# Patient Record
Sex: Male | Born: 1945 | Race: White | Hispanic: No | Marital: Married | Smoking: Never smoker
Health system: Southern US, Community
[De-identification: ages and names within clinical notes are randomized; demographics above are authoritative.]

## PROBLEM LIST (undated history)

## (undated) DIAGNOSIS — M1990 Unspecified osteoarthritis, unspecified site: Secondary | ICD-10-CM

## (undated) DIAGNOSIS — F419 Anxiety disorder, unspecified: Secondary | ICD-10-CM

## (undated) DIAGNOSIS — E039 Hypothyroidism, unspecified: Secondary | ICD-10-CM

## (undated) DIAGNOSIS — R42 Dizziness and giddiness: Secondary | ICD-10-CM

## (undated) DIAGNOSIS — I10 Essential (primary) hypertension: Secondary | ICD-10-CM

## (undated) HISTORY — DX: Essential (primary) hypertension: I10

## (undated) HISTORY — PX: TONSILLECTOMY: SUR1361

## (undated) HISTORY — DX: Anxiety disorder, unspecified: F41.9

---

## 1996-05-20 DIAGNOSIS — Z87442 Personal history of urinary calculi: Secondary | ICD-10-CM

## 1996-05-20 HISTORY — DX: Personal history of urinary calculi: Z87.442

## 2013-05-20 DIAGNOSIS — I739 Peripheral vascular disease, unspecified: Secondary | ICD-10-CM

## 2013-05-20 HISTORY — DX: Peripheral vascular disease, unspecified: I73.9

## 2013-11-08 ENCOUNTER — Encounter: Payer: Self-pay | Admitting: Cardiovascular Disease

## 2014-02-08 ENCOUNTER — Encounter: Payer: Self-pay | Admitting: Cardiovascular Disease

## 2014-02-24 ENCOUNTER — Encounter: Payer: Self-pay | Admitting: Cardiovascular Disease

## 2014-05-16 ENCOUNTER — Ambulatory Visit (INDEPENDENT_AMBULATORY_CARE_PROVIDER_SITE_OTHER): Payer: Medicare Other | Admitting: Cardiovascular Disease

## 2014-05-16 ENCOUNTER — Encounter: Payer: Self-pay | Admitting: Cardiovascular Disease

## 2014-05-16 VITALS — BP 171/79 | HR 65 | Ht 71.0 in | Wt 232.0 lb

## 2014-05-16 DIAGNOSIS — I10 Essential (primary) hypertension: Secondary | ICD-10-CM

## 2014-05-16 DIAGNOSIS — F419 Anxiety disorder, unspecified: Secondary | ICD-10-CM

## 2014-05-16 DIAGNOSIS — R55 Syncope and collapse: Secondary | ICD-10-CM

## 2014-05-16 MED ORDER — CLONAZEPAM 1 MG PO TABS: 1.0000 mg | ORAL_TABLET | Freq: Every day | ORAL | Status: DC | PRN

## 2014-05-16 MED ORDER — AMLODIPINE BESYLATE 2.5 MG PO TABS: 2.5000 mg | ORAL_TABLET | Freq: Every day | ORAL | Status: DC

## 2014-05-16 NOTE — Progress Notes (Signed)
Patient ID: Douglas CoderRussell R Corprew Brewer., male   DOB: 08/13/1945, 68 y.o.   MRN: [id number]       CARDIOLOGY CONSULT NOTE  Patient ID: Douglas CoderRussell R Kouns Brewer. MRN: [id number] DOB/AGE: 66/05/1945 66 y.o.  Admit date: (Not on file) Primary Physician Douglas Brewer,Douglas M, MD  Reason for Consultation: syncope  HPI: The patient is a 68 year old male with a history of hypertension who has been referred for the evaluation of syncope. He has had cardiovascular testing performed at the [facility] in [place]. He underwent a tilt table study on 04/20/2014 which demonstrated postural hypotension and syncope after an SL nitro challenge. A 3 week event monitor demonstrated normal sinus rhythm with no significant pauses or arrhythmias. He has previously undergone stress testing, echocardiography, and a carotid duplex study in October 2015 which were all normal. He had since been started on fludrocortisone and metoprolol, but the Florinef was discontinued due to markedly elevated BP. He had been on amlodipine 5 mg in the past.   His ejection fraction is 60-65% and the echo also showed moderate pulmonary hypertension, RVSP 51 mmHg. Stress testing on 02/24/2014 did not show any evidence of myocardial ischemia or scar.  MRA of the brain on 11/08/13 did not demonstrate any significant intracranial abnormalities. He has a history of frequent dizzy spells which occur when he is either sitting or standing. 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 One syncopal episode occurred while he was walking in his home and the other one occurred while he was riding his lawnmower. He does have antecedent dizziness with complete loss of consciousness. He denies chest pain and palpitations prior to these episodes. He denies leg swelling. He is here with his wife.  Soc: Retired from [employer], where he worked x 27 years.  Lives in [place], [place].  Not on File  Current Outpatient Prescriptions  Medication Sig Dispense Refill  . amitriptyline (ELAVIL) 25 MG tablet Take 25 mg by mouth at bedtime.    Marland Kitchen. aspirin 81 MG tablet Take 81 mg by mouth daily.    . clonazePAM (KLONOPIN) 1 MG tablet Take 1 mg by mouth daily as needed for anxiety (Take one tablet by mouth every evening as needed for insomnia).    . diazepam (VALIUM) 5 MG tablet Take 5 mg by mouth as needed for anxiety (Take 1 once or twice daily as needed).    Marland Kitchen. levothyroxine (SYNTHROID, LEVOTHROID) 25 MCG tablet Take 25 mcg by mouth daily before breakfast.    . losartan (COZAAR) 100 MG tablet Take 100 mg by mouth daily.    . metoprolol tartrate (LOPRESSOR) 25 MG tablet Take 12.5 mg by mouth 2 (two) times daily.     No current facility-administered medications for this visit.    No past medical history on file.  No past surgical history on file.  History   Social History  . Marital Status: Married    Spouse Name: N/A    Number of Children: N/A  . Years of Education: N/A   Occupational History  . Not on file.   Social History Main Topics  . Smoking status: Never Smoker   . Smokeless tobacco: Former NeurosurgeonUser    Types: Chew    Quit date: 04/19/1988  . Alcohol Use: No  . Drug Use: No  . Sexual Activity:    Partners: Female   Other Topics  Concern  . Not on file   Social History Narrative  . No narrative on file     No family history of premature CAD in 1st degree relatives.  Prior to Admission medications   Medication Sig Start Date End Date Taking? Authorizing Provider  amitriptyline (ELAVIL) 25 MG tablet Take 25 mg by mouth at bedtime.   Yes Historical Provider, MD  aspirin 81 MG tablet Take 81 mg by mouth daily.   Yes Historical Provider, MD  clonazePAM (KLONOPIN) 1 MG tablet Take 1 mg by mouth daily as needed for anxiety (Take one tablet by mouth every evening as needed for insomnia).   Yes Historical Provider, MD  diazepam (VALIUM) 5  MG tablet Take 5 mg by mouth as needed for anxiety (Take 1 once or twice daily as needed).   Yes Historical Provider, MD  levothyroxine (SYNTHROID, LEVOTHROID) 25 MCG tablet Take 25 mcg by mouth daily before breakfast.   Yes Historical Provider, MD  losartan (COZAAR) 100 MG tablet Take 100 mg by mouth daily.   Yes Historical Provider, MD  metoprolol tartrate (LOPRESSOR) 25 MG tablet Take 12.5 mg by mouth 2 (two) times daily.   Yes Historical Provider, MD     Review of systems complete and found to be negative unless listed above in HPI     Physical exam Height 5\' 11"  (1.803 Brewer), weight 232 lb (105.235 kg).   BP 164/85, 171/79  Pulse 65  SpO2 96%   General: NAD Neck: No JVD, no thyromegaly or thyroid nodule.  Lungs: Clear to auscultation bilaterally with normal respiratory effort. CV: Nondisplaced PMI. Regular rate and rhythm, normal S1/S2, no S3/S4, no murmur.  No peripheral edema.  No carotid bruit.  Normal pedal pulses.  Abdomen: Soft, nontender, no hepatosplenomegaly, no distention.  Skin: Intact without lesions or rashes.  Neurologic: Alert and oriented x 3.  Psych: Normal affect. Extremities: No clubbing or cyanosis.  HEENT: Normal.   ECG: Most recent ECG reviewed.  Labs:  No results found for: WBC, HGB, HCT, MCV, PLT No results for input(s): NA, K, CL, CO2, BUN, CREATININE, CALCIUM, PROT, BILITOT, ALKPHOS, ALT, AST, GLUCOSE in the last 168 hours.  Invalid input(s): LABALBU No results found for: CKTOTAL, CKMB, CKMBINDEX, TROPONINI No results found for: CHOL No results found for: HDL No results found for: LDLCALC No results found for: TRIG No results found for: CHOLHDL No results found for: LDLDIRECT       Studies: No results found.  ASSESSMENT AND PLAN:  1. Reflex syncope, predominantly vasovagal: This appears to be related to postural hypotension. I educated him on the importance of moving very slowly from the sitting to standing position. I will also prescribe  thigh high compression stockings 20-30 mmHg in order to ameliorate any potential venous pooling which could be contributing to these episodes. Given his history of hypothyroidism, there may be an autoimmune component as well. 2. Essential HTN: Elevated today and SBP in 200 mmHg range at home earlier today. Will start very low dose amlodipine 2.5 mg daily. 3. Anxiety: Will refill clonazepam 1 mg prn daily (thirty pills, no refills), with further prescriptions to be obtained from his PCP.  Dispo: f/u 6 weeks.  Signed: Prentice DockerSuresh Kameran Lallier, Brewer.D., F.A.C.C.  05/16/2014, 2:29 PM

## 2014-05-16 NOTE — Patient Instructions (Signed)
   Begin Norvasc 2.5mg  daily   Clonazepam - one time refill given today, future refills should come from Dr. Willaim [facility]anePark.    Printed scripts provided for medications above. Continue all other medications.   Thigh high compression stockings - script provided Follow up in  6 weeks

## 2014-05-23 ENCOUNTER — Telehealth: Payer: Self-pay | Admitting: Cardiovascular Disease

## 2014-05-23 DIAGNOSIS — R55 Syncope and collapse: Secondary | ICD-10-CM

## 2014-05-23 NOTE — Telephone Encounter (Signed)
Patient still getting really dizzy and almost passed out last about 10-15 seconds. This happened several times this past weekend.

## 2014-05-24 NOTE — Telephone Encounter (Signed)
Is he wearing the thigh high compression stockings I prescribed? What has BP been during these episodes?

## 2014-05-25 NOTE — Telephone Encounter (Signed)
Would have him wear an ambulatory monitor, for a week ideally.

## 2014-05-25 NOTE — Telephone Encounter (Signed)
Patient states he is wearing the stockings.  BP this morning 146/100.  Has not checked his BP during any of these episodes.  Stated he was out in the woods with one of them & at [employer] with the other.  Has had 3 episodes since last OV.  Message sent to provider for advice.

## 2014-05-25 NOTE — Telephone Encounter (Signed)
Were you aware that he had Life Watch monitor 03-22-14 to 04-11-14?  See EPIC under media section 03/22/2014.

## 2014-05-26 NOTE — Telephone Encounter (Signed)
Left message to return call 

## 2014-05-26 NOTE — Telephone Encounter (Signed)
Ambulatory BP monitor x 1 week.

## 2014-05-26 NOTE — Telephone Encounter (Signed)
Patient notified.  Will put in order & notify Douglas Brewer(PCC) for scheduling.

## 2014-06-02 ENCOUNTER — Ambulatory Visit (HOSPITAL_COMMUNITY): Admission: RE | Admit: 2014-06-02 | Payer: Medicare Other | Source: Ambulatory Visit

## 2014-06-06 ENCOUNTER — Ambulatory Visit (HOSPITAL_COMMUNITY)
Admission: RE | Admit: 2014-06-06 | Discharge: 2014-06-06 | Disposition: A | Discharge status: Home / Self Care | Payer: Medicare Other | Source: Ambulatory Visit | Attending: Cardiovascular Disease | Admitting: Cardiovascular Disease

## 2014-06-06 ENCOUNTER — Telehealth: Payer: Self-pay | Admitting: *Deleted

## 2014-06-06 DIAGNOSIS — I10 Essential (primary) hypertension: Secondary | ICD-10-CM | POA: Insufficient documentation

## 2014-06-06 DIAGNOSIS — R55 Syncope and collapse: Secondary | ICD-10-CM | POA: Diagnosis present

## 2014-06-06 NOTE — Telephone Encounter (Signed)
AP stress lab stated they put ambulatory BP monitor on pt for 24 hrs, that unless pt is coming daily, it only is 24 hr monitor. [place] lives in [place] and would be difficult to come daily. Will forward to Dr. Purvis SheffieldKoneswaran

## 2014-06-06 NOTE — Progress Notes (Signed)
24 hour Ambulatory Blood Pressure Monitor in progress. 

## 2014-06-20 ENCOUNTER — Telehealth: Payer: Self-pay | Admitting: *Deleted

## 2014-06-20 MED ORDER — AMLODIPINE BESYLATE 2.5 MG PO TABS: 5.0000 mg | ORAL_TABLET | Freq: Every day | ORAL | Status: DC

## 2014-06-20 NOTE — Telephone Encounter (Signed)
Per Dr. Purvis SheffieldKoneswaran recent review of ambulatory BP monitor - increase Amlodipine to 5mg  daily.  Continue to log readings & bring to follow up OV on 06/27/14 with Dr. Purvis SheffieldKoneswaran.  Patient verbalized understanding.

## 2014-06-22 ENCOUNTER — Encounter: Payer: Self-pay | Admitting: *Deleted

## 2014-06-27 ENCOUNTER — Encounter: Payer: Self-pay | Admitting: Cardiovascular Disease

## 2014-06-27 ENCOUNTER — Ambulatory Visit (INDEPENDENT_AMBULATORY_CARE_PROVIDER_SITE_OTHER): Payer: Medicare Other | Admitting: Cardiovascular Disease

## 2014-06-27 VITALS — BP 158/98 | HR 73 | Ht 71.0 in | Wt 247.0 lb

## 2014-06-27 DIAGNOSIS — R42 Dizziness and giddiness: Secondary | ICD-10-CM

## 2014-06-27 DIAGNOSIS — I10 Essential (primary) hypertension: Secondary | ICD-10-CM

## 2014-06-27 DIAGNOSIS — R55 Syncope and collapse: Secondary | ICD-10-CM

## 2014-06-27 MED ORDER — AMLODIPINE BESYLATE 2.5 MG PO TABS: ORAL_TABLET | ORAL | Status: DC

## 2014-06-27 NOTE — Progress Notes (Signed)
Patient ID: Douglas Scurlock., male   DOB: [birth year], 69 y.o.   MRN: [id number]      SUBJECTIVE: The patient returns for follow-up of reflex syncope and hypertension. He has a history of anxiety as well. He normally wakes up and takes his blood pressure medications. He has been checking his blood pressure for the past week and morning readings have been in the 120-130 systolic range. Three evening readings have shown systolic readings ranging from 156-183. He denies chest pain and shortness of breath he has had two episodes of dizziness but denies syncope. He has dizziness when turning his head to the left.   Review of Systems: As per "subjective", otherwise negative.  Not on File  Current Outpatient Prescriptions  Medication Sig Dispense Refill  . amitriptyline (ELAVIL) 25 MG tablet Take 25 mg by mouth at bedtime.    Marland Kitchen amLODipine (NORVASC) 2.5 MG tablet Take 2 tablets (5 mg total) by mouth daily.    Marland Kitchen aspirin 81 MG tablet Take 81 mg by mouth daily.    . clonazePAM (KLONOPIN) 1 MG tablet Take 1 tablet (1 mg total) by mouth daily as needed for anxiety (Take one tablet by mouth every evening as needed for insomnia). 30 tablet 0  . diazepam (VALIUM) 5 MG tablet Take 5 mg by mouth as needed for anxiety (Take 1 once or twice daily as needed).    Marland Kitchen levothyroxine (SYNTHROID, LEVOTHROID) 25 MCG tablet Take 25 mcg by mouth daily before breakfast.    . losartan (COZAAR) 100 MG tablet Take 100 mg by mouth daily.    . metoprolol tartrate (LOPRESSOR) 25 MG tablet Take 12.5 mg by mouth 2 (two) times daily.     No current facility-administered medications for this visit.    Past Medical History  Diagnosis Date  . Hypertension   . Anxiety     No past surgical history on file.  History   Social History  . Marital Status: Married    Spouse Name: N/A    Number of Children: N/A  . Years of Education: N/A   Occupational History  .      retired from [employer]   Social History Main Topics    . Smoking status: Never Smoker   . Smokeless tobacco: Former Neurosurgeon    Types: Chew    Quit date: 04/19/1988  . Alcohol Use: No  . Drug Use: No  . Sexual Activity:    Partners: Female   Other Topics Concern  . Not on file   Social History Narrative     Filed Vitals:   06/27/14 1358  BP: 158/98  Pulse: 73  Height:  (1.803 m)  Weight: 247 lb (112.038 kg)  SpO2: 97%    PHYSICAL EXAM General: NAD HEENT: Normal. Neck: No JVD, no thyromegaly. Lungs: Clear to auscultation bilaterally with normal respiratory effort. CV: Nondisplaced PMI.  Regular rate and rhythm, normal S1/S2, no S3/S4, no murmur. No pretibial or periankle edema.  No carotid bruit.  Normal pedal pulses.  Abdomen: Soft, nontender, no hepatosplenomegaly, no distention.  Neurologic: Alert and oriented x 3.  Psych: Normal affect. Skin: Normal. Musculoskeletal: Normal range of motion, no gross deformities. Extremities: No clubbing or cyanosis.   ECG: Most recent ECG reviewed.      ASSESSMENT AND PLAN: 1. Reflex syncope, predominantly vasovagal: No recurrences. This appears to be related to postural hypotension. I previously educated him on the importance of moving very slowly from the sitting to standing position.  I previously prescribed thigh high compression stockings 20-30 mmHg in order to ameliorate any potential venous pooling which could be contributing to these episodes. Given his history of hypothyroidism, there may be an autoimmune component as well. 2. Essential HTN: Elevated today. Will increase amlodipine to 5 mg q am and 2.5 mg q pm given elevated evening readings. I have asked him to check his BP 3-4 times per week and to take both morning and evening readings. 3. Dizziness: This appears to be consistent with benign positional vertigo.  Dispo: f/u 6 months.  Prentice DockerSuresh Mattelyn Imhoff, M.D., F.A.C.C.

## 2014-06-27 NOTE — Patient Instructions (Addendum)
   Increase Amlodipine to 5mg  every morning & add extra 2.5mg  every evening - new sent to pharmacy today.  Continue all other medications.   Your physician wants you to follow up in: 6 months.  You will receive a reminder letter in the mail one-two months in advance.  If you don't receive a letter, please call our office to schedule the follow up appointment

## 2014-08-30 ENCOUNTER — Ambulatory Visit (INDEPENDENT_AMBULATORY_CARE_PROVIDER_SITE_OTHER): Payer: Medicare Other | Admitting: Cardiovascular Disease

## 2014-08-30 ENCOUNTER — Encounter: Payer: Self-pay | Admitting: Cardiovascular Disease

## 2014-08-30 VITALS — BP 138/98 | HR 65 | Ht 71.0 in | Wt 249.0 lb

## 2014-08-30 DIAGNOSIS — R55 Syncope and collapse: Secondary | ICD-10-CM

## 2014-08-30 DIAGNOSIS — F419 Anxiety disorder, unspecified: Secondary | ICD-10-CM

## 2014-08-30 DIAGNOSIS — R5383 Other fatigue: Secondary | ICD-10-CM | POA: Diagnosis not present

## 2014-08-30 DIAGNOSIS — H938X2 Other specified disorders of left ear: Secondary | ICD-10-CM

## 2014-08-30 DIAGNOSIS — R42 Dizziness and giddiness: Secondary | ICD-10-CM

## 2014-08-30 DIAGNOSIS — I10 Essential (primary) hypertension: Secondary | ICD-10-CM

## 2014-08-30 NOTE — Patient Instructions (Signed)
   Referral to Neurology - Dr. Milon DikesJohn Brewer  Referral to [facility] Continue all current medications. Labs for TSH, free T4, CBC, BMET Office will contact with results via phone or letter.   Your physician wants you to follow up in: 6 months.  You will receive a reminder letter in the mail one-two months in advance.  If you don't receive a letter, please call our office to schedule the follow up appointment

## 2014-08-30 NOTE — Progress Notes (Signed)
Patient ID: Yvetta CoderRussell R Tomassetti Jr., male   DOB: 11/08/1945, 69 y.o.   MRN: [id number]      SUBJECTIVE: The patient returns for follow-up of reflex syncope and hypertension. He has a history of anxiety as well. Since his last visit with me, he has had several dizzy spells and one syncopal episode lasting 10 seconds. When he bends to the left he feels markedly dizzy. He denies chest pain, palpitations, and shortness of breath. He had a recent sinus infection but his episodic dizziness preceded this. He always has a sensation of left ear fullness prior to these episodes of severe dizziness.   In summary, he has had cardiovascular testing performed at the [facility] in [place]. He underwent a tilt table study on 04/20/2014 which demonstrated postural hypotension and syncope after an SL nitro challenge. A 3 week event monitor demonstrated normal sinus rhythm with no significant pauses or arrhythmias. He has previously undergone stress testing, echocardiography, and a carotid duplex study in October 2015 which were all normal. He had since been started on fludrocortisone and metoprolol, but the Florinef was discontinued due to markedly elevated BP.    His ejection fraction is 60-65% and the echo also showed moderate pulmonary hypertension, RVSP 51 mmHg. Stress testing on 02/24/2014 did not show any evidence of myocardial ischemia or scar.  MRA of the brain on 11/08/13 did not demonstrate any significant intracranial abnormalities. 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   He has hypothyroidism with no labs checked this year.   Review of Systems: As per "subjective", otherwise negative.  Not on File  Current Outpatient Prescriptions  Medication Sig Dispense Refill  . amitriptyline (ELAVIL) 25 MG tablet Take 25 mg by mouth at bedtime.    Marland Kitchen. amLODipine (NORVASC) 2.5 MG  tablet Take 5mg  (2 tabs) by mouth every morning & 2.5mg  (1 tab) every evening 90 tablet 6  . aspirin 81 MG tablet Take 81 mg by mouth daily.    . clonazePAM (KLONOPIN) 1 MG tablet Take 1 tablet (1 mg total) by mouth daily as needed for anxiety (Take one tablet by mouth every evening as needed for insomnia). 30 tablet 0  . diazepam (VALIUM) 5 MG tablet Take 5 mg by mouth as needed for anxiety (Take 1 once or twice daily as needed).    Marland Kitchen. levothyroxine (SYNTHROID, LEVOTHROID) 25 MCG tablet Take 25 mcg by mouth daily before breakfast.    . losartan (COZAAR) 100 MG tablet Take 100 mg by mouth daily.    . metoprolol tartrate (LOPRESSOR) 25 MG tablet Take 12.5 mg by mouth 2 (two) times daily.     No current facility-administered medications for this visit.    Past Medical History  Diagnosis Date  . Hypertension   . Anxiety     History reviewed. No pertinent past surgical history.  History   Social History  . Marital Status: Married    Spouse Name: N/A  . Number of Children: N/A  . Years of Education: N/A   Occupational History  .      retired from [employer]   Social History Main Topics  . Smoking status: Never Smoker   . Smokeless tobacco: Former NeurosurgeonUser    Types: Chew    Quit date: 04/19/1988  . Alcohol Use: No  . Drug Use: No  . Sexual Activity:    Partners:  Female   Other Topics Concern  . Not on file   Social History Narrative     Filed Vitals:   08/30/14 1316  BP: 138/98  Pulse: 65  Height:  (1.803 m)  Weight: 249 lb (112.946 kg)  SpO2: 95%    PHYSICAL EXAM General: NAD HEENT: Normal. Neck: No JVD, no thyromegaly. Lungs: Clear to auscultation bilaterally with normal respiratory effort. CV: Nondisplaced PMI.  Regular rate and rhythm, normal S1/S2, no S3/S4, no murmur. No pretibial or periankle edema.  No carotid bruit.  Normal pedal pulses.  Abdomen: Soft, nontender, no hepatosplenomegaly, no distention.  Neurologic: Alert and oriented x 3.  Psych: Normal  affect. Skin: Normal. Musculoskeletal: Normal range of motion, no gross deformities. Extremities: No clubbing or cyanosis.   ECG: Most recent ECG reviewed.      ASSESSMENT AND PLAN: 1. Reflex syncope, predominantly vasovagal: Noncardiac in etiology. No cardioinhibitory response; thus, pacing not indicated. Given his h/o hypothyoididsm, will check TSH and free T4, along with CBC and BMET.   I previously educated him on the importance of moving very slowly from the sitting to standing position. I previously prescribed thigh high compression stockings 20-30 mmHg in order to ameliorate any potential venous pooling which could be contributing to these episodes. Given his history of hypothyroidism, there may be an autoimmune component as well. I will make referrals to neurology and ENT in [facility] to obtain their input.  2. Essential HTN: Mildly elevated today but much improved overall. He monitors this regularly. Continue present therapy.  3. Dizziness: This appears to be consistent with benign positional vertigo. He felt no improvement with meclizine in the past.  Dispo: f/u 6 months.  Time spent: 40 minutes, of which greater than 50% was spent reviewing symptoms, relevant blood tests and studies, and discussing management plan with the patient.   Prentice Docker, M.D., F.A.C.C.

## 2014-09-09 ENCOUNTER — Other Ambulatory Visit: Payer: Self-pay | Admitting: Otolaryngology

## 2014-09-09 DIAGNOSIS — H903 Sensorineural hearing loss, bilateral: Secondary | ICD-10-CM

## 2014-09-09 DIAGNOSIS — H905 Unspecified sensorineural hearing loss: Secondary | ICD-10-CM

## 2014-09-15 ENCOUNTER — Encounter: Payer: Self-pay | Admitting: *Deleted

## 2014-09-20 ENCOUNTER — Encounter: Payer: Self-pay | Admitting: *Deleted

## 2014-09-20 ENCOUNTER — Telehealth: Payer: Self-pay | Admitting: *Deleted

## 2014-09-20 NOTE — Telephone Encounter (Signed)
Notes Recorded by Lesle ChrisAngela G Rhiana Morash, LPN on 1/6/10965/07/2014 at 8:28 AM Patient notified of results by letter. Will include diet information.

## 2014-09-20 NOTE — Telephone Encounter (Signed)
-----   Message from Laqueta LindenSuresh A Koneswaran, MD sent at 09/19/2014  4:40 PM EDT ----- Rip Harbourk. LDL elevated, would recommend dietary modification.

## 2014-09-22 ENCOUNTER — Ambulatory Visit
Admission: RE | Admit: 2014-09-22 | Discharge: 2014-09-22 | Disposition: A | Discharge status: Home / Self Care | Payer: Medicare Other | Source: Ambulatory Visit | Attending: Otolaryngology | Admitting: Otolaryngology

## 2014-09-22 DIAGNOSIS — H903 Sensorineural hearing loss, bilateral: Secondary | ICD-10-CM

## 2014-09-22 DIAGNOSIS — H905 Unspecified sensorineural hearing loss: Secondary | ICD-10-CM

## 2014-09-22 MED ORDER — GADOBENATE DIMEGLUMINE 529 MG/ML IV SOLN
20.0000 mL | Freq: Once | INTRAVENOUS | Status: AC | PRN
Start: 2014-09-22 — End: 2014-09-22
  Administered 2014-09-22: 20 mL via INTRAVENOUS

## 2014-10-04 ENCOUNTER — Telehealth: Payer: Self-pay | Admitting: Cardiovascular Disease

## 2014-10-04 NOTE — Telephone Encounter (Signed)
5/17 patient callled stating that he be seeing a Neurologist in June at [facility]. He wants to wait until after he sees this doctor before making next appointment.

## 2014-10-04 NOTE — Telephone Encounter (Signed)
Patient was down for recall in June 2016.

## 2014-11-23 ENCOUNTER — Telehealth: Payer: Self-pay | Admitting: Cardiovascular Disease

## 2014-11-23 NOTE — Telephone Encounter (Addendum)
Discussed below with patient.  Last seen by Dr. Purvis SheffieldKoneswaran in April.  Syncope & dizziness were felt to be non-cardiac.  Sent to see Neurology & ENT.  ENT sent for MRI - did not show anything.  Neurologist - put on Divalproex.  Stopped due to side effects.  BP this morning was 126/71.  Overall, running good.  Went to PMD & BP 155/81.  States up at times when go to [place].  Was there for medication refills.  Stated that Neurology felt that he should see cardiology back for follow up.  Stated that this has been going on x 4 years and no one can figure out what his problem is.  Stated these episodes occur when he feels pressure on his ear.  Earlier appointment given for Tuesday, July 12 at 2:00 in [place] office with Dr. Purvis SheffieldKoneswaran.

## 2014-11-23 NOTE — Telephone Encounter (Signed)
Mr. Douglas Brewer called stating that he felt lightheaded approximately 25 minutes ago.  States that it lasted just a few seconds. He states " I feel very weak"

## 2014-11-29 ENCOUNTER — Encounter: Payer: Self-pay | Admitting: Cardiovascular Disease

## 2014-11-29 ENCOUNTER — Ambulatory Visit (INDEPENDENT_AMBULATORY_CARE_PROVIDER_SITE_OTHER): Payer: Medicare Other | Admitting: Cardiovascular Disease

## 2014-11-29 VITALS — BP 140/86 | HR 60 | Ht 71.0 in | Wt 248.0 lb

## 2014-11-29 DIAGNOSIS — R55 Syncope and collapse: Secondary | ICD-10-CM

## 2014-11-29 DIAGNOSIS — R5383 Other fatigue: Secondary | ICD-10-CM | POA: Diagnosis not present

## 2014-11-29 DIAGNOSIS — F419 Anxiety disorder, unspecified: Secondary | ICD-10-CM

## 2014-11-29 DIAGNOSIS — R42 Dizziness and giddiness: Secondary | ICD-10-CM | POA: Diagnosis not present

## 2014-11-29 DIAGNOSIS — H938X2 Other specified disorders of left ear: Secondary | ICD-10-CM | POA: Diagnosis not present

## 2014-11-29 NOTE — Patient Instructions (Signed)
Your physician recommends that you schedule a follow-up appointment in: as needed with Dr Purvis SheffieldKoneswaran   Your physician recommends that you continue on your current medications as directed. Please refer to the Current Medication list given to you today.    We will make you an apt to see Dr Graciela HusbandsKlein, our electrophysiologist       Thank you for choosing [facility] !

## 2014-11-29 NOTE — Progress Notes (Signed)
Patient ID: Douglas CoderRussell R Novello Jr., male   DOB: 11/26/1945, 69 y.o.   MRN: [id number]      SUBJECTIVE: The patient returns for follow-up of reflex syncope and hypertension. He has a history of anxiety as well.  Labs in April showed normal TSH, unremarkable CBC and basic metabolic panel, and elevated LDL of 123.  He reportedly saw an ENT specialist who obtained an MRI which did not show any remarkable findings (chronic small vessel ischemic disease). He was evaluated by neurology who placed him on divalproex but this was stopped due to side effects.  He said he initially felt better after taking 250 mg of divalproex prescribed by Dr. Leanna BattlesMalone. When he increased it to twice daily dosing, he began to have leg swelling and insomnia. On 250 mg, he felt good enough and mowed three lawns without difficulty.   He had a transient episode of syncope 2 weeks ago and fell and hit his head but immediately regained consciousness. Since being on amitriptyline, he has no longer had any vomiting. He said he underwent an EEG which was also found to be normal.  He denies chest pain, palpitations, and shortness of breath. He always has a sensation of left ear fullness prior to these episodes of severe dizziness.  In summary, he has had cardiovascular testing performed at the [facility] in [place]. He underwent a tilt table study on 04/20/2014 which demonstrated postural hypotension and syncope after an SL nitro challenge. A 3 week event monitor demonstrated normal sinus rhythm with no significant pauses or arrhythmias. He has previously undergone stress testing, echocardiography, and a carotid duplex study in October 2015 which were all normal. He had since been started on fludrocortisone and metoprolol, but the Florinef was discontinued due to markedly elevated BP.   His ejection fraction is 60-65% and the echo also showed moderate pulmonary hypertension, RVSP 51 mmHg. Stress testing on  02/24/2014 did not show any evidence of myocardial ischemia or scar.  MRA of the brain on 11/08/13 did not demonstrate any significant intracranial abnormalities. 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   He has hypothyroidism.   Review of Systems: As per "subjective", otherwise negative.  Allergies  Allergen Reactions  . Divalproex Sodium Swelling    Leg swell  . Topamax [Topiramate] Other (See Comments)    Lowers blood pressure     Current Outpatient Prescriptions  Medication Sig Dispense Refill  . amitriptyline (ELAVIL) 25 MG tablet Take 25 mg by mouth at bedtime.    Marland Kitchen. amLODipine (NORVASC) 2.5 MG tablet Take 5mg  (2 tabs) by mouth every morning & 2.5mg  (1 tab) every evening 90 tablet 6  . aspirin 81 MG tablet Take 81 mg by mouth daily.    . clonazePAM (KLONOPIN) 1 MG tablet Take 1 tablet (1 mg total) by mouth daily as needed for anxiety (Take one tablet by mouth every evening as needed for insomnia). 30 tablet 0  . diazepam (VALIUM) 5 MG tablet Take 5 mg by mouth as needed for anxiety (Take 1 once or twice daily as needed).    Marland Kitchen. levothyroxine (SYNTHROID, LEVOTHROID) 25 MCG tablet Take 25 mcg by mouth daily before breakfast.    . losartan (COZAAR) 100 MG tablet Take 100 mg by mouth daily.    . metoprolol tartrate (LOPRESSOR) 25 MG tablet Take 12.5 mg by mouth 2 (two) times  daily.     No current facility-administered medications for this visit.    Past Medical History  Diagnosis Date  . Hypertension   . Anxiety     No past surgical history on file.  History   Social History  . Marital Status: Married    Spouse Name: N/A  . Number of Children: N/A  . Years of Education: N/A   Occupational History  .      retired from [employer]   Social History Main Topics  . Smoking status: Never Smoker   . Smokeless tobacco: Former Neurosurgeon    Types: Chew    Quit date:  04/19/1988  . Alcohol Use: No  . Drug Use: No  . Sexual Activity:    Partners: Female, Male   Other Topics Concern  . Not on file   Social History Narrative     Filed Vitals:   11/29/14 1344  BP: 140/86  Pulse: 60  Height:  (1.803 m)  Weight: 248 lb (112.492 kg)  SpO2: 94%    PHYSICAL EXAM General: NAD HEENT: Normal. Neck: No JVD, no thyromegaly. Lungs: Clear to auscultation bilaterally with normal respiratory effort. CV: Nondisplaced PMI.  Regular rate and rhythm, normal S1/S2, no S3/S4, no murmur. No pretibial or periankle edema.  No carotid bruit.  Normal pedal pulses.  Abdomen: Soft, nontender, no hepatosplenomegaly, no distention.  Neurologic: Alert and oriented x 3.  Psych: Normal affect. Skin: Normal. Musculoskeletal: Normal range of motion, no gross deformities. Extremities: No clubbing or cyanosis.   ECG: Most recent ECG reviewed.      ASSESSMENT AND PLAN: 1. Reflex syncope, predominantly vasovagal: Appears noncardiac in etiology. No cardioinhibitory response; thus, pacing not indicated.   I previously educated him on the importance of moving very slowly from the sitting to standing position. I previously prescribed thigh high compression stockings 20-30 mmHg in order to ameliorate any potential venous pooling which could be contributing to these episodes. Given his history of hypothyroidism, there may be an autoimmune component as well. I previously made referrals to neurology and ENT in [facility] to obtain their input with details noted above. Will have him see Dr. Berton Mount to obtain his input. If Dr. Graciela Husbands is unable to provide any additional input, would consider a referral to [facility].  2. Essential HTN: Mildly elevated today but much improved overall. He monitors this regularly. Continue present therapy.  3. Dizziness: This appears to be consistent with benign positional vertigo. He felt no improvement with meclizine in the past.  Dispo: f/u  prn.  Prentice Docker, M.D., F.A.C.C.

## 2014-12-02 ENCOUNTER — Other Ambulatory Visit: Payer: Self-pay

## 2014-12-07 ENCOUNTER — Other Ambulatory Visit: Payer: Self-pay

## 2014-12-07 ENCOUNTER — Encounter: Payer: Self-pay | Admitting: Internal Medicine

## 2014-12-07 ENCOUNTER — Ambulatory Visit (INDEPENDENT_AMBULATORY_CARE_PROVIDER_SITE_OTHER): Payer: Medicare Other | Admitting: Internal Medicine

## 2014-12-07 VITALS — BP 160/100 | HR 56 | Ht 71.0 in | Wt 248.6 lb

## 2014-12-07 DIAGNOSIS — R55 Syncope and collapse: Secondary | ICD-10-CM | POA: Diagnosis not present

## 2014-12-07 NOTE — Patient Instructions (Signed)
Medication Instructions:  Your physician recommends that you continue on your current medications as directed. Please refer to the Current Medication list given to you today.   Labwork: None ordered   Testing/Procedures: None ordered   Follow-Up: Your physician recommends that you schedule a follow-up appointment as needed    Any Other Special Instructions Will Be Listed Below (If Applicable).   

## 2014-12-07 NOTE — Assessment & Plan Note (Signed)
The patient has neurally mediated syncope and most likely has predominantly vasodepression. There has been no evidence of bradycardia. I have recommended he maintain adequate hydration, avoid caffeine and ETOH. We cannot ask him to increase his sodium intake as he has modest hypertension at baseline. Finally, I've asked the patient to lie down when he feels a spell coming on. I do not think that insertion of an implantable loop recorder will add much at this point.

## 2014-12-07 NOTE — Progress Notes (Signed)
      HPI Mr. Douglas Brewer is referred today by Dr. Darl HouseholderKoneswaren for evaluation of syncope. He has had multiple episodes of syncope including one which was associated with his wearing a cardiac monitor which showed no bradycardia. He usually has a warning where he feels weak, gets dizzy, feels warm and may have a HA. He underewent head up tilt table testing [facility] [place] which demonstrated a syncopal episode. In addition, the patient has hypertension and is on multiple medications. Allergies  Allergen Reactions  . Divalproex Sodium Swelling    Leg swell  . Topamax [Topiramate] Other (See Comments)    Lowers blood pressure      Current Outpatient Prescriptions  Medication Sig Dispense Refill  . amitriptyline (ELAVIL) 25 MG tablet Take 25 mg by mouth at bedtime.    Marland Kitchen. amLODipine (NORVASC) 5 MG tablet Take 5 mg by mouth daily.    Marland Kitchen. aspirin 81 MG tablet Take 81 mg by mouth daily.    . clonazePAM (KLONOPIN) 1 MG tablet Take 1 mg by mouth at bedtime. insomnia    . diazepam (VALIUM) 5 MG tablet Take 5 mg by mouth as needed for anxiety (Take 1 tablet once or twice daily as needed).     Marland Kitchen. levothyroxine (SYNTHROID, LEVOTHROID) 25 MCG tablet Take 25 mcg by mouth daily before breakfast.    . losartan (COZAAR) 100 MG tablet Take 100 mg by mouth daily.    . metoprolol tartrate (LOPRESSOR) 25 MG tablet Take 12.5 mg by mouth 2 (two) times daily.     No current facility-administered medications for this visit.     Past Medical History  Diagnosis Date  . Hypertension   . Anxiety     ROS:   All systems reviewed and negative except as noted in the HPI.   No past surgical history on file.   Family History  Problem Relation Age of Onset  . Stroke Mother   . Heart disease Father   . Heart attack Father   . Hypertension Brother      History   Social History  . Marital Status: Married    Spouse Name: N/A  . Number of Children: N/A  . Years of Education: N/A   Occupational  History  .      retired from [employer]   Social History Main Topics  . Smoking status: Never Smoker   . Smokeless tobacco: Former NeurosurgeonUser    Types: Chew    Quit date: 04/19/1988  . Alcohol Use: No  . Drug Use: No  . Sexual Activity:    Partners: Female, Male   Other Topics Concern  . Not on file   Social History Narrative     BP 160/100 mmHg  Pulse 56  Ht 5\' 11"  (1.803 m)  Wt 248 lb 9.6 oz (112.764 kg)  BMI 34.69 kg/m2  Physical Exam:  Well appearing 69 year old man, NAD HEENT: Unremarkable Neck:  6 cm JVD, no thyromegally Back:  No CVA tenderness Lungs:  Clear, with no wheezes, rales, or rhonchi. HEART:  Regular rate rhythm, no murmurs, no rubs, no clicks Abd:  soft, positive bowel sounds, no organomegally, no rebound, no guarding Ext:  2 plus pulses, no edema, no cyanosis, no clubbing Skin:  No rashes no nodules Neuro:  CN II through XII intact, motor grossly intact  EKG - nsr, cannot rule out inferior MI   Assess/Plan:

## 2014-12-13 ENCOUNTER — Ambulatory Visit: Payer: Medicare Other | Admitting: Cardiovascular Disease

## 2014-12-20 ENCOUNTER — Telehealth: Payer: Self-pay | Admitting: Cardiovascular Disease

## 2014-12-20 DIAGNOSIS — R55 Syncope and collapse: Secondary | ICD-10-CM

## 2014-12-20 NOTE — Telephone Encounter (Signed)
Refer to neurology at [facility] to start.

## 2014-12-20 NOTE — Telephone Encounter (Signed)
Pt states he wants to see someone at [facility] as his sx's have been persistent for 3 years

## 2014-12-20 NOTE — Telephone Encounter (Signed)
Referral placed to [facility] neurology,pt aware

## 2014-12-20 NOTE — Telephone Encounter (Signed)
Patient states that he has had three "pre syncopal episodes" since weekend. / tg

## 2014-12-20 NOTE — Telephone Encounter (Signed)
I am not certain there is anything we're able to offer given Dr. Lubertha Basque assessment and recommendations. I have made previous referrals to other specialists (ENT) to treat any additional potential confounding issues. Can consider referral to [facility].

## 2014-12-20 NOTE — Telephone Encounter (Signed)
See that Dr Ladona Ridgel saw pt, no special recommendations made.How do you want me to handle this?

## 2015-02-02 ENCOUNTER — Encounter: Payer: Self-pay | Admitting: Adult Health

## 2015-02-02 ENCOUNTER — Ambulatory Visit (INDEPENDENT_AMBULATORY_CARE_PROVIDER_SITE_OTHER): Payer: Medicare Other | Admitting: Adult Health

## 2015-02-02 VITALS — BP 150/98 | HR 67 | Ht 71.0 in | Wt 243.2 lb

## 2015-02-02 DIAGNOSIS — F488 Other specified nonpsychotic mental disorders: Secondary | ICD-10-CM | POA: Diagnosis not present

## 2015-02-02 DIAGNOSIS — I10 Essential (primary) hypertension: Secondary | ICD-10-CM | POA: Diagnosis not present

## 2015-02-02 MED ORDER — AMLODIPINE BESYLATE 5 MG PO TABS: 7.5000 mg | ORAL_TABLET | Freq: Every day | ORAL | Status: DC

## 2015-02-02 NOTE — Progress Notes (Deleted)
Name: Douglas Brewer.    DOB: 1946/03/07  Age: 69 y.o.  MR#: [id number]       PCP:  Ardyth Man, MD      Insurance: Payor: MEDICARE / Plan: MEDICARE PART A AND B / Product Type: *No Product type* /   CC:    Chief Complaint  Patient presents with  . Loss of Consciousness    Reflex  . Hypertension    VS Filed Vitals:   02/02/15 1541  BP: 150/98  Pulse: 67  Height: 5\' 11"  (1.803 m)  Weight: 243 lb 3.2 oz (110.315 kg)  SpO2: 96%    Weights Current Weight  02/02/15 243 lb 3.2 oz (110.315 kg)  12/07/14 248 lb 9.6 oz (112.764 kg)  11/29/14 248 lb (112.492 kg)    Blood Pressure  BP Readings from Last 3 Encounters:  02/02/15 150/98  12/07/14 160/100  11/29/14 140/86     Admit date:  (Not on file) Last encounter with RMR:  Visit date not found   Allergy Divalproex sodium and Topamax  Current Outpatient Prescriptions  Medication Sig Dispense Refill  . amitriptyline (ELAVIL) 25 MG tablet Take 25 mg by mouth at bedtime.    Marland Kitchen amLODipine (NORVASC) 5 MG tablet Take 5 mg by mouth daily.    Marland Kitchen aspirin 81 MG tablet Take 81 mg by mouth daily.    . clonazePAM (KLONOPIN) 1 MG tablet Take 1 mg by mouth at bedtime. insomnia    . diazepam (VALIUM) 5 MG tablet Take 5 mg by mouth as needed for anxiety (Take 1 tablet once or twice daily as needed).     Marland Kitchen levothyroxine (SYNTHROID, LEVOTHROID) 25 MCG tablet Take 25 mcg by mouth daily before breakfast.    . losartan (COZAAR) 100 MG tablet Take 100 mg by mouth daily.    . metoprolol tartrate (LOPRESSOR) 25 MG tablet Take 12.5 mg by mouth 2 (two) times daily.     No current facility-administered medications for this visit.    Discontinued Meds:   There are no discontinued medications.  Patient Active Problem List   Diagnosis Date Noted  . Vasovagal syncope 05/16/2014    LABS No results found for: NA, K, CL, CO2, GLUCOSE, BUN, CREATININE, CALCIUM, GFRNONAA, GFRAA CMP  No results found for: NA, K, CL, CO2, GLUCOSE, BUN, CREATININE,  CALCIUM, PROT, ALBUMIN, AST, ALT, ALKPHOS, BILITOT, GFRNONAA, GFRAA  No results found for: WBC, HGB, HCT, MCV  Lipid Panel  No results found for: CHOL, TRIG, HDL, CHOLHDL, VLDL, LDLCALC, LDLDIRECT  ABG No results found for: PHART, PCO2ART, PO2ART, HCO3, TCO2, ACIDBASEDEF, O2SAT   No results found for: TSH BNP (last 3 results) No results for input(s): BNP in the last 8760 hours.  ProBNP (last 3 results) No results for input(s): PROBNP in the last 8760 hours.  Cardiac Panel (last 3 results) No results for input(s): CKTOTAL, CKMB, TROPONINI, RELINDX in the last 72 hours.  Iron/TIBC/Ferritin/ %Sat No results found for: IRON, TIBC, FERRITIN, IRONPCTSAT   EKG Orders placed or performed in visit on 12/07/14  . EKG 12-Lead     Prior Assessment and Plan Problem List as of 02/02/2015      Cardiovascular and Mediastinum   Vasovagal syncope   Last Assessment & Plan 12/07/2014 Office Visit Written 12/07/2014  5:51 PM by Marinus Maw, MD    The patient has neurally mediated syncope and most likely has predominantly vasodepression. There has been no evidence of bradycardia. I have recommended he maintain adequate  hydration, avoid caffeine and ETOH. We cannot ask him to increase his sodium intake as he has modest hypertension at baseline. Finally, I've asked the patient to lie down when he feels a spell coming on. I do not think that insertion of an implantable loop recorder will add much at this point.          Imaging: No results found.

## 2015-02-02 NOTE — Progress Notes (Signed)
Cardiology Office Note   Date:  02/02/2015   ID:  Douglas Brewer., DOB [birth year], MRN [id number]  PCP:  Ardyth Man, MD  Cardiologist:  Inis Sizer, NP   Chief Complaint  Patient presents with  . Loss of Consciousness    Reflex  . Hypertension      History of Present Illness: Douglas Brewer. is a 69 y.o. male who presents for for follow-up of reflex syncope and hypertension. He has a history of anxiety as well. He was seen by Dr. Ladona Ridgel with note below     "The patient has neurally mediated syncope and most likely has predominantly vasodepression. There has been no evidence of bradycardia. I have recommended he maintain adequate hydration, avoid caffeine and ETOH. We cannot ask him to increase his sodium intake as he has modest hypertension at baseline. Finally, I've asked the patient to lie down when he feels a spell coming on. I do not think that insertion of an implantable loop recorder will add much at this point."   He has been referred to neurology through [facility].   He comes today with continued complaints of syncope. He has been to ENT and neurology and had several tests with no definitive answer. He has had a syncopal episode and dizziness several times a week. Denies racing HR or palpitations.    Past Medical History  Diagnosis Date  . Hypertension   . Anxiety     No past surgical history on file.   Current Outpatient Prescriptions  Medication Sig Dispense Refill  . amitriptyline (ELAVIL) 25 MG tablet Take 25 mg by mouth at bedtime.    Marland Kitchen amLODipine (NORVASC) 5 MG tablet Take 5 mg by mouth daily.    Marland Kitchen aspirin 81 MG tablet Take 81 mg by mouth daily.    . clonazePAM (KLONOPIN) 1 MG tablet Take 1 mg by mouth at bedtime. insomnia    . diazepam (VALIUM) 5 MG tablet Take 5 mg by mouth as needed for anxiety (Take 1 tablet once or twice daily as needed).     Marland Kitchen levothyroxine (SYNTHROID, LEVOTHROID) 25 MCG tablet Take 25 mcg by mouth daily before  breakfast.    . losartan (COZAAR) 100 MG tablet Take 100 mg by mouth daily.    . metoprolol tartrate (LOPRESSOR) 25 MG tablet Take 12.5 mg by mouth 2 (two) times daily.     No current facility-administered medications for this visit.    Allergies:   Divalproex sodium and Topamax    Social History:  The patient  reports that he has never smoked. He quit smokeless tobacco use about 26 years ago. His smokeless tobacco use included Chew. He reports that he does not drink alcohol or use illicit drugs.   Family History:  The patient's family history includes Heart attack in his father; Heart disease in his father; Hypertension in his brother; Stroke in his mother.    ROS: All other systems are reviewed and negative. Unless otherwise mentioned in H&P    PHYSICAL EXAM: VS:  BP 150/98 mmHg  Pulse 67  Ht 5\' 11"  (1.803 m)  Wt 243 lb 3.2 oz (110.315 kg)  BMI 33.93 kg/m2  SpO2 96% , BMI Body mass index is 33.93 kg/(m^2). GEN: Well nourished, well developed, in no acute distress HEENT: normal Neck: no JVD, carotid bruits, or masses Cardiac: RRR; no murmurs, rubs, or gallops,no edema  Respiratory:  clear to auscultation bilaterally, normal work of breathing GI: soft, nontender, nondistended, +  BS MS: no deformity or atrophy Skin: warm and dry, no rash Neuro:  Strength and sensation are intact Psych: euthymic mood, full affect     Recent Labs: No results found for requested labs within last 365 days.    Lipid Panel No results found for: CHOL, TRIG, HDL, CHOLHDL, VLDL, LDLCALC, LDLDIRECT    Wt Readings from Last 3 Encounters:  02/02/15 243 lb 3.2 oz (110.315 kg)  12/07/14 248 lb 9.6 oz (112.764 kg)  11/29/14 248 lb (112.492 kg)        ASSESSMENT AND PLAN:  1.  Syncope: I have reviewed his medications with him. He is on valium, Effexor and klonopin which may be contributing to dizziness but not necessarily Syncope. He is to followup with PCP who has prescribed this to evaluate  need to stop or adjust doses. He gets dizzy when he sits too much. I will discontinue BB. He is only on 12.5 mg BID with low normal HR. He will take BP and HR daily and record. Bring back with follow up appt.   .2.  Hypertension: Due to change in dose of BB, I will increase amlodipine to 7.5 mg daily. He will keep a record of his BP. He has a wrist cuff and I have advised a brachial cuff for better accuracy.    Current medicines are reviewed at length with the patient today.    LNo orders of the defined types were placed in this encounter.     Disposition:   FU with SK in one month.  Signed, Joni Reining, NP  02/02/2015 3:49 PM    [facility] [address] [place], [place] [address] Phone: [phone]; Fax: [phone]

## 2015-02-02 NOTE — Patient Instructions (Signed)
Your physician recommends that you schedule a follow-up appointment in: 1 month with Dr. Purvis Sheffield.  Your physician has recommended you make the following change in your medication:   STOP Taking Metoprolol  Increase Amlodipine (Norvasc) to 7.5mg  (1.5 Tablets) Daily.  Please get an Arm Blood pressure cuff   Your physician has requested that you regularly monitor and record your blood pressure readings at home for 2 weeks. Please use the same machine at the same time of day to check your readings and record them to bring to your follow-up visit.     Thank you for choosing [facility]!

## 2015-02-20 ENCOUNTER — Telehealth: Payer: Self-pay | Admitting: Cardiovascular Disease

## 2015-02-20 NOTE — Telephone Encounter (Signed)
Dizzy spells once last week lasted less than 5 minutes and once again while hunting.BP has ranged 142/91,151/90,146/93 HR mid 60's/Amlodipine just increased and pt c/o leg swelling from knees down.

## 2015-02-20 NOTE — Telephone Encounter (Signed)
Pt notified,will come Wed,10/5 at 4 pm

## 2015-02-20 NOTE — Telephone Encounter (Signed)
Will have some edema with amlodipine. Will need to be seen for other symptoms. Schedule nurse visit for orthostatics. Decrease salt in diet.

## 2015-02-20 NOTE — Telephone Encounter (Signed)
Pt is having dizzy spells and leg swelling

## 2015-02-21 ENCOUNTER — Telehealth: Payer: Self-pay | Admitting: *Deleted

## 2015-02-21 NOTE — Telephone Encounter (Signed)
BP log placed in KL's folder for review.

## 2015-02-22 ENCOUNTER — Ambulatory Visit (INDEPENDENT_AMBULATORY_CARE_PROVIDER_SITE_OTHER): Payer: Medicare Other | Admitting: *Deleted

## 2015-02-22 DIAGNOSIS — Z136 Encounter for screening for cardiovascular disorders: Secondary | ICD-10-CM | POA: Diagnosis not present

## 2015-02-22 DIAGNOSIS — I10 Essential (primary) hypertension: Secondary | ICD-10-CM

## 2015-02-22 DIAGNOSIS — Z0130 Encounter for examination of blood pressure without abnormal findings: Secondary | ICD-10-CM

## 2015-02-22 DIAGNOSIS — Z23 Encounter for immunization: Secondary | ICD-10-CM | POA: Diagnosis not present

## 2015-02-22 NOTE — Progress Notes (Signed)
Patient states that he feels fine today. Pt that on Monday he had a "dizzy spell" and again on yesterday. Pt reports that on both occasions the spells only lasted only 10 sec. Patient denies nausea at this time.

## 2015-02-23 ENCOUNTER — Encounter: Payer: Self-pay | Admitting: Adult Health

## 2015-02-23 NOTE — Progress Notes (Signed)
Reviewed BP and HR. All within normal limits. Continue his current regimen. I will discuss this with him on follow up. No changes to medications at this time.

## 2015-02-23 NOTE — Progress Notes (Signed)
Will continue to monitor off BB. He will continue treatment by neurology and with PCP as well.

## 2015-03-01 ENCOUNTER — Telehealth: Payer: Self-pay | Admitting: Adult Health

## 2015-03-01 MED ORDER — AMLODIPINE BESYLATE 5 MG PO TABS: 10.0000 mg | ORAL_TABLET | Freq: Every day | ORAL | Status: DC

## 2015-03-01 NOTE — Telephone Encounter (Signed)
Patient still having problems with elevated BP after med change. / tg

## 2015-03-01 NOTE — Telephone Encounter (Signed)
RE: Pt triage call  Received: Today    Laqueta LindenSuresh A Koneswaran, MD  Baird LyonsSherri L Billee Balcerzak, RN           Please increase amlodipine to 10 mg. Thanks.   Dr. Purvis SheffieldKoneswaran

## 2015-03-01 NOTE — Telephone Encounter (Signed)
lmtcb

## 2015-03-01 NOTE — Telephone Encounter (Signed)
Patient returns my call. Reports BPs running from 140-177/90-110s.   HRs avg 70s Reports no symptoms w/ HTN. Will review with DOD for recommendations. Patient aware I will call him back w/ orders, and is agreeable to plan.

## 2015-03-01 NOTE — Telephone Encounter (Signed)
Advised patient on recommendation below.   Sent in update to pharmacy under 5mg  tablets. (If patient does well on 10 mg dosing, then at office visit later this month we can send in new rx for 10 mg tablets) Patient verbalized understanding and agreeable to plan.

## 2015-03-20 ENCOUNTER — Ambulatory Visit (INDEPENDENT_AMBULATORY_CARE_PROVIDER_SITE_OTHER): Payer: Medicare Other | Admitting: Cardiovascular Disease

## 2015-03-20 ENCOUNTER — Encounter: Payer: Self-pay | Admitting: Cardiovascular Disease

## 2015-03-20 ENCOUNTER — Ambulatory Visit: Payer: Medicare Other | Admitting: Cardiovascular Disease

## 2015-03-20 VITALS — BP 148/88 | HR 73 | Ht 71.0 in | Wt 245.0 lb

## 2015-03-20 DIAGNOSIS — I10 Essential (primary) hypertension: Secondary | ICD-10-CM | POA: Diagnosis not present

## 2015-03-20 DIAGNOSIS — R55 Syncope and collapse: Secondary | ICD-10-CM

## 2015-03-20 NOTE — Patient Instructions (Signed)
Your physician recommends that you schedule a follow-up appointment in:prn,only as needed    Thank you for choosing [facility] !

## 2015-03-20 NOTE — Progress Notes (Signed)
Patient ID: Douglas CoderRussell R Whitis Brewer., male   DOB: 01/29/1946, 69 y.o.   MRN: [id number]      SUBJECTIVE: The patient has a history of neurally mediated syncope, predominantly vasodepressor type. He saw Dr. Ladona Ridgelaylor in EP who did not recommend an implantable loop recorder as there was no evidence of bradycardia with prior event monitoring. He was encouraged to stay adequately hydrated. He also has essential hypertension. He has seen K. Lawrence NP since his last visit with me and with Dr. Ladona Ridgelaylor.  Approximately 2-3 weeks ago, he had 2 or 3 episodes of dizziness lasting about 10-20 seconds which spontaneously resolved with rest for 20 minutes. Since that time he has been practicing some exercises given to him by a neighbor which sound characteristic of Epley maneuvers.  Since having practiced these on a daily basis, he has had no further episodes of left ear pressure, dizziness, or syncope.   Review of Systems: As per "subjective", otherwise negative.  Allergies  Allergen Reactions  . Divalproex Sodium Swelling    Leg swell  . Topamax [Topiramate] Other (See Comments)    Lowers blood pressure     Current Outpatient Prescriptions  Medication Sig Dispense Refill  . amitriptyline (ELAVIL) 25 MG tablet Take 25 mg by mouth at bedtime.    Marland Kitchen. amLODipine (NORVASC) 5 MG tablet Take 2 tablets (10 mg total) by mouth daily. 60 tablet 3  . aspirin 81 MG tablet Take 81 mg by mouth daily.    . clonazePAM (KLONOPIN) 1 MG tablet Take 1 mg by mouth at bedtime. insomnia    . diazepam (VALIUM) 5 MG tablet Take 5 mg by mouth as needed for anxiety (Take 1 tablet once or twice daily as needed).     Marland Kitchen. levothyroxine (SYNTHROID, LEVOTHROID) 25 MCG tablet Take 25 mcg by mouth daily before breakfast.    . losartan (COZAAR) 100 MG tablet Take 100 mg by mouth daily.     No current facility-administered medications for this visit.    Past Medical History  Diagnosis Date  . Hypertension   . Anxiety     No past  surgical history on file.  Social History   Social History  . Marital Status: Married    Spouse Name: N/A  . Number of Children: N/A  . Years of Education: N/A   Occupational History  .      retired from [employer]   Social History Main Topics  . Smoking status: Never Smoker   . Smokeless tobacco: Former NeurosurgeonUser    Types: Chew    Quit date: 04/19/1988  . Alcohol Use: No  . Drug Use: No  . Sexual Activity:    Partners: Female, Male   Other Topics Concern  . Not on file   Social History Narrative     Filed Vitals:   03/20/15 1010  BP: 148/88  Pulse: 73  Height: 5\' 11"  (1.803 m)  Weight: 245 lb (111.131 kg)  SpO2: 99%    PHYSICAL EXAM General: NAD HEENT: Normal. Neck: No JVD, no thyromegaly. Lungs: Clear to auscultation bilaterally with normal respiratory effort. CV: Nondisplaced PMI.  Regular rate and rhythm, normal S1/S2, no S3/S4, no murmur. No pretibial or periankle edema.   Abdomen: Soft, obese, no distention.  Neurologic: Alert and oriented x 3.  Psych: Normal affect. Skin: Normal. Musculoskeletal: Normal range of motion, no gross deformities. Extremities: No clubbing or cyanosis.   ECG: Most recent ECG reviewed.      ASSESSMENT AND PLAN: 1.  Essential HTN: Mildly elevated. Can be monitored by PCP. I have encouraged exercise (walking) and weight loss as this can help decrease BP.  2. Neurally mediated syncope, predominantly vasodepressor type: Follows with neurology. Symptoms have improved with Epley maneuvers.  Dispo: f/u prn.   Prentice Docker, M.D., F.A.C.C.

## 2018-05-20 HISTORY — PX: JOINT REPLACEMENT: SHX530

## 2018-06-23 NOTE — Patient Instructions (Addendum)
Douglas Brewer.  06/23/2018   Your procedure is scheduled on: 07/01/2018  Report to [facility] Main  Entrance              Report to admitting at   1150 AM    Call this number if you have problems the morning of surgery [phone]    Remember: Do not eat food  :After Midnight. May have clear liquids from midnight up until 0820 am then nothing until after surgery!    CLEAR LIQUID DIET   Foods Allowed                                                                     Foods Excluded  Coffee and tea, regular and decaf                             liquids that you cannot  Plain Jell-O in any flavor                                             see through such as: Fruit ices (not with fruit pulp)                                     milk, soups, orange juice  Iced Popsicles                                    All solid food Carbonated beverages, regular and diet                                    Cranberry, grape and apple juices Sports drinks like Gatorade Lightly seasoned clear broth or consume(fat free) Sugar, honey syrup  Sample Menu Breakfast                                Lunch                                     Supper Cranberry juice                    Beef broth                            Chicken broth Jell-O                                     Grape juice  Apple juice Coffee or tea                        Jell-O                                      Popsicle                                                Coffee or tea                        Coffee or tea  _____________________________________________________________________               BRUSH YOUR TEETH MORNING OF SURGERY AND RINSE YOUR MOUTH OUT, NO CHEWING GUM CANDY OR MINTS.     Take these medicines the morning of surgery with A SIP OF WATER: Amlodipine (Norvasc), Diazepam (Valium), Levothyroxine (Synthroid)                                You may not have any  metal on your body including hair pins and              piercings  Do not wear jewelry, make-up, lotions, powders or perfumes, deodorant             Men may shave face and neck.   Do not bring valuables to the hospital. [facility] IS NOT             RESPONSIBLE   FOR VALUABLES.  Contacts, dentures or bridgework may not be worn into surgery.  Leave suitcase in the car. After surgery it may be brought to your room.                  Please read over the following fact sheets you were given: _____________________________________________________________________             [facility] - Preparing [facility] Before surgery, you can play an important role.  Because skin is not sterile, your skin needs to be as free of germs as possible.  You can reduce the number of germs on your skin by washing with CHG (chlorahexidine gluconate) soap before surgery.  CHG is an antiseptic cleaner which kills germs and bonds with the skin to continue killing germs even after washing. Please DO NOT use if you have an allergy to CHG or antibacterial soaps.  If your skin becomes reddened/irritated stop using the CHG and inform your nurse when you arrive at Short Stay. Do not shave (including legs and underarms) for at least 48 hours prior to the first CHG shower.  You may shave your face/neck. Please follow these instructions carefully:  1.  Shower with CHG Soap the night before surgery and the  morning of Surgery.  2.  If you choose to wash your hair, wash your hair first as usual with your  normal  shampoo.  3.  After you shampoo, rinse your hair and body thoroughly to remove the  shampoo.                           4.  Use CHG as you would any other liquid soap.  You can apply chg directly  to the skin and wash                       Gently with a scrungie or clean washcloth.  5.  Apply the CHG Soap to your body ONLY FROM THE NECK DOWN.   Do not use on face/ open                           Wound or open sores.  Avoid contact with eyes, ears mouth and genitals (private parts).                       Wash face,  Genitals (private parts) with your normal soap.             6.  Wash thoroughly, paying special attention to the area where your surgery  will be performed.  7.  Thoroughly rinse your body with warm water from the neck down.  8.  DO NOT shower/wash with your normal soap after using and rinsing off  the CHG Soap.                9.  Pat yourself dry with a clean towel.            10.  Wear clean pajamas.            11.  Place clean sheets on your bed the night of your first shower and do not  sleep with pets. Day of Surgery : Do not apply any lotions/deodorants the morning of surgery.  Please wear clean clothes to the hospital/surgery center.  FAILURE TO FOLLOW THESE INSTRUCTIONS MAY RESULT IN THE CANCELLATION OF YOUR SURGERY PATIENT SIGNATURE_________________________________  NURSE SIGNATURE__________________________________  ________________________________________________________________________   Rogelia Mire  An incentive spirometer is a tool that can help keep your lungs clear and active. This tool measures how well you are filling your lungs with each breath. Taking long deep breaths may help reverse or decrease the chance of developing breathing (pulmonary) problems (especially infection) following:  A long period of time when you are unable to move or be active. BEFORE THE PROCEDURE   If the spirometer includes an indicator to show your best effort, your nurse or respiratory therapist will set it to a desired goal.  If possible, sit up straight or lean slightly forward. Try not to slouch.  Hold the incentive spirometer in an upright position. INSTRUCTIONS FOR USE  1. Sit on the edge of your bed if possible, or sit up as far as you can in bed or on a chair. 2. Hold the incentive spirometer in an upright position. 3. Breathe out normally. 4. Place the mouthpiece in your  mouth and seal your lips tightly around it. 5. Breathe in slowly and as deeply as possible, raising the piston or the ball toward the top of the column. 6. Hold your breath for 3-5 seconds or for as long as possible. Allow the piston or ball to fall to the bottom of the column. 7. Remove the mouthpiece from your mouth and breathe out normally. 8. Rest for a few seconds and repeat Steps 1 through 7 at least 10 times every 1-2 hours when you are awake. Take your time and take a few normal breaths between deep breaths. 9. The spirometer may include an indicator to show your  best effort. Use the indicator as a goal to work toward during each repetition. 10. After each set of 10 deep breaths, practice coughing to be sure your lungs are clear. If you have an incision (the cut made at the time of surgery), support your incision when coughing by placing a pillow or rolled up towels firmly against it. Once you are able to get out of bed, walk around indoors and cough well. You may stop using the incentive spirometer when instructed by your caregiver.  RISKS AND COMPLICATIONS  Take your time so you do not get dizzy or light-headed.  If you are in pain, you may need to take or ask for pain medication before doing incentive spirometry. It is harder to take a deep breath if you are having pain. AFTER USE  Rest and breathe slowly and easily.  It can be helpful to keep track of a log of your progress. Your caregiver can provide you with a simple table to help with this. If you are using the spirometer at home, follow these instructions: SEEK MEDICAL CARE IF:   You are having difficultly using the spirometer.  You have trouble using the spirometer as often as instructed.  Your pain medication is not giving enough relief while using the spirometer.  You develop fever of 100.5 F (38.1 C) or higher. SEEK IMMEDIATE MEDICAL CARE IF:   You cough up bloody sputum that had not been present before.  You  develop fever of 102 F (38.9 C) or greater.  You develop worsening pain at or near the incision site. MAKE SURE YOU:   Understand these instructions.  Will watch your condition.  Will get help right away if you are not doing well or get worse. Document Released: 09/16/2006 Document Revised: 07/29/2011 Document Reviewed: 11/17/2006 ExitCare Patient Information 2014 ExitCare, [place].   ________________________________________________________________________  WHAT IS A BLOOD TRANSFUSION? Blood Transfusion Information  A transfusion is the replacement of blood or some of its parts. Blood is made up of multiple cells which provide different functions.  Red blood cells carry oxygen and are used for blood loss replacement.  White blood cells fight against infection.  Platelets control bleeding.  Plasma helps clot blood.  Other blood products are available for specialized needs, such as hemophilia or other clotting disorders. BEFORE THE TRANSFUSION  Who gives blood for transfusions?   Healthy volunteers who are fully evaluated to make sure their blood is safe. This is blood bank blood. Transfusion therapy is the safest it has ever been in the practice of medicine. Before blood is taken from a donor, a complete history is taken to make sure that person has no history of diseases nor engages in risky social behavior (examples are intravenous drug use or sexual activity with multiple partners). The donor's travel history is screened to minimize risk of transmitting infections, such as malaria. The donated blood is tested for signs of infectious diseases, such as HIV and hepatitis. The blood is then tested to be sure it is compatible with you in order to minimize the chance of a transfusion reaction. If you or a relative donates blood, this is often done in anticipation of surgery and is not appropriate for emergency situations. It takes many days to process the donated blood. RISKS AND  COMPLICATIONS Although transfusion therapy is very safe and saves many lives, the main dangers of transfusion include:   Getting an infectious disease.  Developing a transfusion reaction. This is an allergic reaction to something  in the blood you were given. Every precaution is taken to prevent this. The decision to have a blood transfusion has been considered carefully by your caregiver before blood is given. Blood is not given unless the benefits outweigh the risks. AFTER THE TRANSFUSION  Right after receiving a blood transfusion, you will usually feel much better and more energetic. This is especially true if your red blood cells have gotten low (anemic). The transfusion raises the level of the red blood cells which carry oxygen, and this usually causes an energy increase.  The nurse administering the transfusion will monitor you carefully for complications. HOME CARE INSTRUCTIONS  No special instructions are needed after a transfusion. You may find your energy is better. Speak with your caregiver about any limitations on activity for underlying diseases you may have. SEEK MEDICAL CARE IF:   Your condition is not improving after your transfusion.  You develop redness or irritation at the intravenous (IV) site. SEEK IMMEDIATE MEDICAL CARE IF:  Any of the following symptoms occur over the next 12 hours:  Shaking chills.  You have a temperature by mouth above 102 F (38.9 C), not controlled by medicine.  Chest, back, or muscle pain.  People around you feel you are not acting correctly or are confused.  Shortness of breath or difficulty breathing.  Dizziness and fainting.  You get a rash or develop hives.  You have a decrease in urine output.  Your urine turns a dark color or changes to pink, red, or brown. Any of the following symptoms occur over the next 10 days:  You have a temperature by mouth above 102 F (38.9 C), not controlled by medicine.  Shortness of  breath.  Weakness after normal activity.  The white part of the eye turns yellow (jaundice).  You have a decrease in the amount of urine or are urinating less often.  Your urine turns a dark color or changes to pink, red, or brown. Document Released: 05/03/2000 Document Revised: 07/29/2011 Document Reviewed: 12/21/2007 [facility] Patient Information 2014 [place], [place].  _______________________________________________________________________

## 2018-06-23 NOTE — Progress Notes (Signed)
04/15/2028- office note Nettie Elm. [facility] on chart. 04/02/2018- Medical Clearance from Dr. Lucianne [facility]uss [place] on chart  03/17/2018- note on chart from Nettie Elm. [facility]  02/24/2018- note on chart from [place]. [facility]  01/27/2018- labs on chart from [facility]. [facility]-CMP, Lipid panel, HgA1C, TSH, Prostate, Vitamin D 25,

## 2018-06-24 ENCOUNTER — Other Ambulatory Visit: Payer: Self-pay

## 2018-06-24 ENCOUNTER — Encounter (HOSPITAL_COMMUNITY): Payer: Self-pay

## 2018-06-24 ENCOUNTER — Encounter (HOSPITAL_COMMUNITY)
Admission: RE | Admit: 2018-06-24 | Discharge: 2018-06-24 | Disposition: A | Discharge status: Home / Self Care | Payer: Medicare Other | Source: Ambulatory Visit | Attending: Orthopedic Surgery | Admitting: Orthopedic Surgery

## 2018-06-24 DIAGNOSIS — M1612 Unilateral primary osteoarthritis, left hip: Secondary | ICD-10-CM | POA: Diagnosis not present

## 2018-06-24 DIAGNOSIS — I4510 Unspecified right bundle-branch block: Secondary | ICD-10-CM | POA: Diagnosis not present

## 2018-06-24 DIAGNOSIS — I10 Essential (primary) hypertension: Secondary | ICD-10-CM | POA: Diagnosis not present

## 2018-06-24 DIAGNOSIS — R9431 Abnormal electrocardiogram [ECG] [EKG]: Secondary | ICD-10-CM | POA: Diagnosis not present

## 2018-06-24 DIAGNOSIS — Z01818 Encounter for other preprocedural examination: Secondary | ICD-10-CM | POA: Diagnosis not present

## 2018-06-24 HISTORY — DX: Dizziness and giddiness: R42

## 2018-06-24 HISTORY — DX: Hypothyroidism, unspecified: E03.9

## 2018-06-24 HISTORY — DX: Unspecified osteoarthritis, unspecified site: M19.90

## 2018-06-24 LAB — COMPREHENSIVE METABOLIC PANEL
ALK PHOS: 70 U/L (ref 38–126)
ALT: 16 U/L (ref 0–44)
AST: 15 U/L (ref 15–41)
Albumin: 4 g/dL (ref 3.5–5.0)
Anion gap: 7 (ref 5–15)
BUN: 18 mg/dL (ref 8–23)
CALCIUM: 9 mg/dL (ref 8.9–10.3)
CO2: 28 mmol/L (ref 22–32)
Chloride: 106 mmol/L (ref 98–111)
Creatinine, Ser: 1.17 mg/dL (ref 0.61–1.24)
GFR calc Af Amer: >60 mL/min (ref >60)
GFR calc non Af Amer: >60 mL/min (ref >60)
Glucose, Bld: 91 mg/dL (ref 70–99)
Potassium: 4.1 mmol/L (ref 3.5–5.1)
Sodium: 141 mmol/L (ref 135–145)
TOTAL PROTEIN: 7.1 g/dL (ref 6.5–8.1)
Total Bilirubin: 0.2 mg/dL — ABNORMAL LOW (ref 0.3–1.2)

## 2018-06-24 LAB — SURGICAL PCR SCREEN
MRSA, PCR: NEGATIVE
Staphylococcus aureus: POSITIVE — AB

## 2018-06-24 LAB — CBC
HCT: 37.3 % — ABNORMAL LOW (ref 39.0–52.0)
Hemoglobin: 11.1 g/dL — ABNORMAL LOW (ref 13.0–17.0)
MCH: 23.3 pg — ABNORMAL LOW (ref 26.0–34.0)
MCHC: 29.8 g/dL — ABNORMAL LOW (ref 30.0–36.0)
MCV: 78.2 fL — ABNORMAL LOW (ref 80.0–100.0)
Platelets: 363 10*3/uL (ref 150–400)
RBC: 4.77 MIL/uL (ref 4.22–5.81)
RDW: 15.4 % (ref 11.5–15.5)
WBC: 13.1 10*3/uL — ABNORMAL HIGH (ref 4.0–10.5)
nRBC: 0 % (ref 0.0–0.2)

## 2018-06-24 LAB — PROTIME-INR
INR: 1.01
Prothrombin Time: 13.2 seconds (ref 11.4–15.2)

## 2018-06-24 LAB — ABO/RH: ABO/RH(D): O NEG

## 2018-06-24 LAB — APTT: aPTT: 27 seconds (ref 24–36)

## 2018-06-24 NOTE — Progress Notes (Signed)
Chart given to [facility] for Anesthesia to review medical history and chart for patient having moderate pulmonary hypertension.

## 2018-06-24 NOTE — H&P (Signed)
TOTAL HIP ADMISSION H&P  Patient is admitted for left total hip arthroplasty.  Subjective:  Chief Complaint: left hip pain  HPI: Douglas CoderRussell R Midgley Jr., 73 y.o. male, has a history of pain and functional disability in the left hip(s) due to arthritis and patient has failed non-surgical conservative treatments for greater than 12 weeks to include corticosteriod injections and activity modification.  Onset of symptoms was gradual starting 3 years ago with gradually worsening course since that time.The patient noted no past surgery on the left hip(s).  Patient currently rates pain in the left hip at 9 out of 10 with activity. Patient has night pain, worsening of pain with activity and weight bearing and pain that interfers with activities of daily living. Patient has evidence of severe bone-on-bone arthritis with massive osteophyte and subchondral cystic formation by imaging studies. This condition presents safety issues increasing the risk of falls. There is no current active infection.  Patient Active Problem List   Diagnosis Date Noted  . Vasovagal syncope 05/16/2014   Past Medical History:  Diagnosis Date  . Anxiety   . Hypertension     No past surgical history on file.  No current facility-administered medications for this encounter.    Current Outpatient Medications  Medication Sig Dispense Refill Last Dose  . amitriptyline (ELAVIL) 25 MG tablet Take 25 mg by mouth at bedtime.   Taking  . amLODipine (NORVASC) 10 MG tablet Take 10 mg by mouth daily.     Marland Kitchen. aspirin 81 MG tablet Take 81 mg by mouth every other day.    Taking  . clonazePAM (KLONOPIN) 1 MG tablet Take 1 mg by mouth at bedtime.    Taking  . diazepam (VALIUM) 5 MG tablet Take 5 mg by mouth every morning.    Taking  . ibuprofen (ADVIL,MOTRIN) 800 MG tablet Take 800 mg by mouth every 8 (eight) hours as needed for moderate pain.     Marland Kitchen. levothyroxine (SYNTHROID, LEVOTHROID) 75 MCG tablet Take 75 mcg by mouth daily before  breakfast.    Taking  . losartan (COZAAR) 100 MG tablet Take 100 mg by mouth daily.   Taking  . meclizine (ANTIVERT) 25 MG tablet Take 25 mg by mouth 2 (two) times daily.     Marland Kitchen. amLODipine (NORVASC) 5 MG tablet Take 2 tablets (10 mg total) by mouth daily. (Patient not taking: Reported on 06/11/2018) 60 tablet 3 Not Taking at Unknown time   Allergies  Allergen Reactions  . Divalproex Sodium Swelling    Leg swell  . Topamax [Topiramate] Other (See Comments)    Lowers blood pressure     Social History   Tobacco Use  . Smoking status: Never Smoker  . Smokeless tobacco: Former NeurosurgeonUser    Types: Chew  Substance Use Topics  . Alcohol use: No    Alcohol/week: 0.0 standard drinks    Family History  Problem Relation Age of Onset  . Stroke Mother   . Heart disease Father   . Heart attack Father   . Hypertension Brother      Review of Systems  Constitutional: Negative for chills and fever.  HENT: Negative for congestion, sore throat and tinnitus.   Eyes: Negative for double vision, photophobia and pain.  Respiratory: Negative for cough, shortness of breath and wheezing.   Cardiovascular: Negative for chest pain, palpitations and orthopnea.  Gastrointestinal: Negative for heartburn, nausea and vomiting.  Genitourinary: Negative for dysuria, frequency and urgency.  Musculoskeletal: Positive for joint pain.  Neurological:  Negative for dizziness, weakness and headaches.    Objective:  Physical Exam  Well nourished and well developed.  General: Alert and oriented x3, cooperative and pleasant, no acute distress.  Head: normocephalic, atraumatic, neck supple.  Eyes: EOMI.  Respiratory: breath sounds clear in all fields, no wheezing, rales, or rhonchi. Cardiovascular: Regular rate and rhythm, no murmurs, gallops or rubs.  Abdomen: non-tender to palpation and soft, normoactive bowel sounds. Musculoskeletal:  Left Hip Exam: ROM: Flexion to 90, No Internal Rotation, No External Rotation,  and abduction 10 with pain. There is no tenderness over the greater trochanter bursa. There is pain on attempted provocative testing of the hip.  Calves soft and nontender. Motor function intact in LE. Strength 5/5 LE bilaterally. Neuro: Distal pulses 2+. Sensation to light touch intact in LE.  Vital signs in last 24 hours: Blood pressure: 142/98 mmHg  Labs:   Estimated body mass index is 34.17 kg/m as calculated from the following:   Height as of 03/20/15: 5\' 11"  (1.803 m).   Weight as of 03/20/15: 111.1 kg.   Imaging Review Plain radiographs demonstrate severe degenerative joint disease of the left hip(s). The bone quality appears to be adequate for age and reported activity level.    Preoperative templating of the joint replacement has been completed, documented, and submitted to the Operating Room personnel in order to optimize intra-operative equipment management.     Assessment/Plan:  End stage arthritis, left hip(s)  The patient history, physical examination, clinical judgement of the provider and imaging studies are consistent with end stage degenerative joint disease of the left hip(s) and total hip arthroplasty is deemed medically necessary. The treatment options including medical management, injection therapy, arthroscopy and arthroplasty were discussed at length. The risks and benefits of total hip arthroplasty were presented and reviewed. The risks due to aseptic loosening, infection, stiffness, dislocation/subluxation,  thromboembolic complications and other imponderables were discussed.  The patient acknowledged the explanation, agreed to proceed with the plan and consent was signed. Patient is being admitted for inpatient treatment for surgery, pain control, PT, OT, prophylactic antibiotics, VTE prophylaxis, progressive ambulation and ADL's and discharge planning.The patient is planning to be discharged home.   Therapy Plans: HEP Disposition: Home with wife Planned  DVT Prophylaxis: Aspirin 325 mg BID DME needed: None PCP: Geoffry Paradise, MD TXA: IV Allergies: NKDA Anesthesia Concerns: None BMI: 34  - Patient was instructed on what medications to stop prior to surgery. - Follow-up visit in 2 weeks with Dr. Lequita Halt - Begin physical therapy following surgery - Pre-operative lab work as pre-surgical testing - Prescriptions will be provided in hospital at time of discharge  Arther Abbott, PA-C Orthopedic Surgery [facility] [place]

## 2018-06-26 NOTE — Progress Notes (Signed)
Anesthesia Chart Review   Case:  [id number] Date/Time:  07/01/18 1405   Procedure:  TOTAL HIP ARTHROPLASTY ANTERIOR APPROACH (Left ) - 100min   Anesthesia type:  Choice   Pre-op diagnosis:  left hip osteoarthritis   Location:  [facility] ROOM 10 / [facility]   Surgeon:  Ollen GrossAluisio, Frank, MD      DISCUSSION:73 yo never smoker with h/o HTN, hypothyroidism, anxiety, left hip OA scheduled for above surgery on 07/01/18 with Dr. Ollen GrossFrank Aluisio.   Clearance received from PCP, Dr. Priscella MannAngela Oh Park, on 04/02/18 which states pt is low risk for surgical procedure.    Pt can proceed with planned procedure barring acute status change.  VS: BP (!) 166/89 (BP Location: Right Arm) Comment: Time: 9:11am  155/89  Pulse 84   Temp 36.8 C (Oral)   Resp 18   Ht 5\' 11"  (1.803 m)   Wt 108.4 kg   SpO2 99%   BMI 33.33 kg/m   PROVIDERS: Oh Park, Etta QuillAngela J, MD is PCP    LABS: Labs reviewed: Acceptable for surgery. (all labs ordered are listed, but only abnormal results are displayed)  Labs Reviewed  SURGICAL PCR SCREEN - Abnormal; Notable for the following components:      Result Value   Staphylococcus aureus POSITIVE (*)    All other components within normal limits  CBC - Abnormal; Notable for the following components:   WBC 13.1 (*)    Hemoglobin 11.1 (*)    HCT 37.3 (*)    MCV 78.2 (*)    MCH 23.3 (*)    MCHC 29.8 (*)    All other components within normal limits  COMPREHENSIVE METABOLIC PANEL - Abnormal; Notable for the following components:   Total Bilirubin 0.2 (*)    All other components within normal limits  APTT  PROTIME-INR  TYPE AND SCREEN  ABO/RH     IMAGES:   EKG: 06/24/2018 Normal sinus rhythm Right bundle branch block  Abnormal ECG   CV: Echo 02/24/14 Summary  Normal LV size with normal function. The ejection fraction is 60-65%. There is severe concentric hypertrophy Normal right ventricular size with normal function Moderate pulmonary hypertension   Past Medical History:   Diagnosis Date  . Anxiety   . Arthritis   . Hypertension   . Hypothyroidism   . Vertigo     Past Surgical History:  Procedure Laterality Date  . TONSILLECTOMY      MEDICATIONS: . amitriptyline (ELAVIL) 25 MG tablet  . amLODipine (NORVASC) 10 MG tablet  . amLODipine (NORVASC) 5 MG tablet  . aspirin 81 MG tablet  . clonazePAM (KLONOPIN) 1 MG tablet  . diazepam (VALIUM) 5 MG tablet  . ibuprofen (ADVIL,MOTRIN) 800 MG tablet  . levothyroxine (SYNTHROID, LEVOTHROID) 75 MCG tablet  . losartan (COZAAR) 100 MG tablet  . meclizine (ANTIVERT) 25 MG tablet   No current facility-administered medications for this encounter.      Janey GentaJessica Cynthya Yam, PA-C [facility] Pre-Surgical Testing [phone] 06/26/18 11:38 AM

## 2018-06-26 NOTE — Anesthesia Preprocedure Evaluation (Addendum)
Anesthesia Evaluation  Patient identified by MRN, date of birth, ID band Patient awake    Reviewed: Allergy & Precautions, NPO status , Patient's Chart, lab work & pertinent test results  History of Anesthesia Complications Negative for: history of anesthetic complications  Airway Mallampati: II  TM Distance: >3 FB Neck ROM: Full    Dental  (+) Dental Advisory Given, Teeth Intact   Pulmonary neg pulmonary ROS,    breath sounds clear to auscultation       Cardiovascular hypertension, Pt. on medications  Rhythm:Regular Rate:Normal     Neuro/Psych PSYCHIATRIC DISORDERS Anxiety  Vertigo     GI/Hepatic negative GI ROS, Neg liver ROS,   Endo/Other  Hypothyroidism  Obesity   Renal/GU negative Renal ROS     Musculoskeletal  (+) Arthritis ,   Abdominal   Peds  Hematology  (+) anemia ,   Anesthesia Other Findings   Reproductive/Obstetrics                           Anesthesia Physical Anesthesia Plan  ASA: II  Anesthesia Plan: Spinal   Post-op Pain Management:    Induction:   PONV Risk Score and Plan: 1 and Treatment may vary due to age or medical condition and Propofol infusion  Airway Management Planned: Natural Airway and Simple Face Mask  Additional Equipment: None  Intra-op Plan:   Post-operative Plan:   Informed Consent: I have reviewed the patients History and Physical, chart, labs and discussed the procedure including the risks, benefits and alternatives for the proposed anesthesia with the patient or authorized representative who has indicated his/her understanding and acceptance.       Plan Discussed with: CRNA and Anesthesiologist  Anesthesia Plan Comments: (Labs reviewed, platelets acceptable. Discussed risks and benefits of spinal, including spinal/epidural hematoma, infection, failed block, and PDPH. Patient expressed understanding and wished to proceed. )       Anesthesia Quick Evaluation

## 2018-07-01 ENCOUNTER — Encounter (HOSPITAL_COMMUNITY)
Admission: RE | Disposition: A | Discharge status: Home / Self Care | Payer: Self-pay | Source: Home / Self Care | Attending: Orthopedic Surgery

## 2018-07-01 ENCOUNTER — Inpatient Hospital Stay (HOSPITAL_COMMUNITY): Payer: Medicare Other | Admitting: Physician Assistant

## 2018-07-01 ENCOUNTER — Other Ambulatory Visit: Payer: Self-pay

## 2018-07-01 ENCOUNTER — Inpatient Hospital Stay (HOSPITAL_COMMUNITY): Payer: Medicare Other

## 2018-07-01 ENCOUNTER — Telehealth (HOSPITAL_COMMUNITY): Payer: Self-pay | Admitting: *Deleted

## 2018-07-01 ENCOUNTER — Inpatient Hospital Stay (HOSPITAL_COMMUNITY): Payer: Medicare Other | Admitting: Anesthesiology

## 2018-07-01 ENCOUNTER — Encounter (HOSPITAL_COMMUNITY): Payer: Self-pay | Admitting: Certified Registered"

## 2018-07-01 ENCOUNTER — Inpatient Hospital Stay (HOSPITAL_COMMUNITY)
Admission: RE | Admit: 2018-07-01 | Discharge: 2018-07-06 | DRG: 470 | Disposition: A | Discharge status: Home / Self Care | Payer: Medicare Other | Attending: Orthopedic Surgery | Admitting: Orthopedic Surgery

## 2018-07-01 DIAGNOSIS — Z7982 Long term (current) use of aspirin: Secondary | ICD-10-CM | POA: Diagnosis not present

## 2018-07-01 DIAGNOSIS — I10 Essential (primary) hypertension: Secondary | ICD-10-CM | POA: Diagnosis present

## 2018-07-01 DIAGNOSIS — Z823 Family history of stroke: Secondary | ICD-10-CM | POA: Diagnosis not present

## 2018-07-01 DIAGNOSIS — Z87891 Personal history of nicotine dependence: Secondary | ICD-10-CM

## 2018-07-01 DIAGNOSIS — M169 Osteoarthritis of hip, unspecified: Secondary | ICD-10-CM | POA: Diagnosis not present

## 2018-07-01 DIAGNOSIS — D72829 Elevated white blood cell count, unspecified: Secondary | ICD-10-CM | POA: Diagnosis present

## 2018-07-01 DIAGNOSIS — R682 Dry mouth, unspecified: Secondary | ICD-10-CM | POA: Diagnosis not present

## 2018-07-01 DIAGNOSIS — Z6833 Body mass index (BMI) 33.0-33.9, adult: Secondary | ICD-10-CM

## 2018-07-01 DIAGNOSIS — E669 Obesity, unspecified: Secondary | ICD-10-CM | POA: Diagnosis present

## 2018-07-01 DIAGNOSIS — I4510 Unspecified right bundle-branch block: Secondary | ICD-10-CM | POA: Diagnosis present

## 2018-07-01 DIAGNOSIS — Z8249 Family history of ischemic heart disease and other diseases of the circulatory system: Secondary | ICD-10-CM

## 2018-07-01 DIAGNOSIS — D62 Acute posthemorrhagic anemia: Secondary | ICD-10-CM | POA: Diagnosis not present

## 2018-07-01 DIAGNOSIS — Z96649 Presence of unspecified artificial hip joint: Secondary | ICD-10-CM

## 2018-07-01 DIAGNOSIS — M1612 Unilateral primary osteoarthritis, left hip: Secondary | ICD-10-CM | POA: Diagnosis present

## 2018-07-01 DIAGNOSIS — E039 Hypothyroidism, unspecified: Secondary | ICD-10-CM | POA: Diagnosis present

## 2018-07-01 DIAGNOSIS — R42 Dizziness and giddiness: Secondary | ICD-10-CM | POA: Diagnosis not present

## 2018-07-01 DIAGNOSIS — Z419 Encounter for procedure for purposes other than remedying health state, unspecified: Secondary | ICD-10-CM | POA: Diagnosis not present

## 2018-07-01 DIAGNOSIS — Z79890 Hormone replacement therapy (postmenopausal): Secondary | ICD-10-CM | POA: Diagnosis not present

## 2018-07-01 DIAGNOSIS — F419 Anxiety disorder, unspecified: Secondary | ICD-10-CM | POA: Diagnosis present

## 2018-07-01 DIAGNOSIS — R55 Syncope and collapse: Secondary | ICD-10-CM | POA: Diagnosis present

## 2018-07-01 HISTORY — PX: TOTAL HIP ARTHROPLASTY: SHX124

## 2018-07-01 LAB — TYPE AND SCREEN
ABO/RH(D): O NEG
Antibody Screen: NEGATIVE

## 2018-07-01 SURGERY — ARTHROPLASTY, HIP, TOTAL, ANTERIOR APPROACH
Anesthesia: Spinal | Site: Hip | Laterality: Left

## 2018-07-01 MED ORDER — TRANEXAMIC ACID-NACL 1000-0.7 MG/100ML-% IV SOLN
1000.0000 mg | INTRAVENOUS | Status: AC
  Administered 2018-07-01: 1000 mg via INTRAVENOUS
  Filled 2018-07-01: qty 100

## 2018-07-01 MED ORDER — BISACODYL 10 MG RE SUPP: 10.0000 mg | Freq: Every day | RECTAL | Status: DC | PRN

## 2018-07-01 MED ORDER — PHENYLEPHRINE 40 MCG/ML (10ML) SYRINGE FOR IV PUSH (FOR BLOOD PRESSURE SUPPORT)
PREFILLED_SYRINGE | INTRAVENOUS | Status: AC
  Filled 2018-07-01: qty 10

## 2018-07-01 MED ORDER — LOSARTAN POTASSIUM 50 MG PO TABS
100.0000 mg | ORAL_TABLET | Freq: Every day | ORAL | Status: DC
  Administered 2018-07-02 – 2018-07-06 (×4): 100 mg via ORAL
  Filled 2018-07-01 (×5): qty 2

## 2018-07-01 MED ORDER — DIAZEPAM 5 MG PO TABS
5.0000 mg | ORAL_TABLET | Freq: Every morning | ORAL | Status: DC
  Administered 2018-07-02 – 2018-07-06 (×5): 5 mg via ORAL
  Filled 2018-07-01 (×5): qty 1

## 2018-07-01 MED ORDER — FLEET ENEMA 7-19 GM/118ML RE ENEM: 1.0000 | ENEMA | Freq: Once | RECTAL | Status: DC | PRN

## 2018-07-01 MED ORDER — CEFAZOLIN SODIUM-DEXTROSE 2-4 GM/100ML-% IV SOLN
2.0000 g | Freq: Four times a day (QID) | INTRAVENOUS | Status: AC
  Administered 2018-07-01 (×2): 2 g via INTRAVENOUS
  Filled 2018-07-01 (×2): qty 100

## 2018-07-01 MED ORDER — 0.9 % SODIUM CHLORIDE (POUR BTL) OPTIME
TOPICAL | Status: DC | PRN
  Administered 2018-07-01: 1000 mL

## 2018-07-01 MED ORDER — LIDOCAINE 2% (20 MG/ML) 5 ML SYRINGE
INTRAMUSCULAR | Status: AC
  Filled 2018-07-01: qty 5

## 2018-07-01 MED ORDER — OXYCODONE HCL 5 MG PO TABS: 5.0000 mg | ORAL_TABLET | Freq: Once | ORAL | Status: DC | PRN

## 2018-07-01 MED ORDER — PROPOFOL 10 MG/ML IV BOLUS
INTRAVENOUS | Status: AC
  Filled 2018-07-01: qty 20

## 2018-07-01 MED ORDER — LACTATED RINGERS IV SOLN
INTRAVENOUS | Status: DC
  Administered 2018-07-01 (×3): via INTRAVENOUS

## 2018-07-01 MED ORDER — FENTANYL CITRATE (PF) 100 MCG/2ML IJ SOLN: 25.0000 ug | INTRAMUSCULAR | Status: DC | PRN

## 2018-07-01 MED ORDER — CLONAZEPAM 1 MG PO TABS
1.0000 mg | ORAL_TABLET | Freq: Every day | ORAL | Status: DC
  Administered 2018-07-01 – 2018-07-05 (×5): 1 mg via ORAL
  Filled 2018-07-01 (×6): qty 1

## 2018-07-01 MED ORDER — METHOCARBAMOL 500 MG PO TABS: 500.0000 mg | ORAL_TABLET | Freq: Four times a day (QID) | ORAL | Status: DC | PRN

## 2018-07-01 MED ORDER — BUPIVACAINE HCL (PF) 0.25 % IJ SOLN
INTRAMUSCULAR | Status: AC
  Filled 2018-07-01: qty 30

## 2018-07-01 MED ORDER — AMITRIPTYLINE HCL 25 MG PO TABS
25.0000 mg | ORAL_TABLET | Freq: Every day | ORAL | Status: DC
  Administered 2018-07-01 – 2018-07-05 (×5): 25 mg via ORAL
  Filled 2018-07-01 (×6): qty 1

## 2018-07-01 MED ORDER — FENTANYL CITRATE (PF) 100 MCG/2ML IJ SOLN
INTRAMUSCULAR | Status: AC
  Filled 2018-07-01: qty 2

## 2018-07-01 MED ORDER — CHLORHEXIDINE GLUCONATE 4 % EX LIQD: 60.0000 mL | Freq: Once | CUTANEOUS | Status: DC

## 2018-07-01 MED ORDER — ONDANSETRON HCL 4 MG/2ML IJ SOLN
INTRAMUSCULAR | Status: AC
  Filled 2018-07-01: qty 2

## 2018-07-01 MED ORDER — DEXAMETHASONE SODIUM PHOSPHATE 10 MG/ML IJ SOLN
10.0000 mg | Freq: Once | INTRAMUSCULAR | Status: AC
  Administered 2018-07-02: 10 mg via INTRAVENOUS
  Filled 2018-07-01: qty 1

## 2018-07-01 MED ORDER — ASPIRIN EC 325 MG PO TBEC
325.0000 mg | DELAYED_RELEASE_TABLET | Freq: Two times a day (BID) | ORAL | Status: DC
  Administered 2018-07-02 – 2018-07-06 (×9): 325 mg via ORAL
  Filled 2018-07-01 (×10): qty 1

## 2018-07-01 MED ORDER — LIDOCAINE 2% (20 MG/ML) 5 ML SYRINGE
INTRAMUSCULAR | Status: DC | PRN
  Administered 2018-07-01: 50 mg via INTRAVENOUS

## 2018-07-01 MED ORDER — BUPIVACAINE HCL (PF) 0.25 % IJ SOLN
INTRAMUSCULAR | Status: DC | PRN
  Administered 2018-07-01: 30 mL

## 2018-07-01 MED ORDER — BUPIVACAINE IN DEXTROSE 0.75-8.25 % IT SOLN
INTRATHECAL | Status: DC | PRN
  Administered 2018-07-01: 1.6 mL via INTRATHECAL

## 2018-07-01 MED ORDER — METOCLOPRAMIDE HCL 5 MG/ML IJ SOLN: 5.0000 mg | Freq: Three times a day (TID) | INTRAMUSCULAR | Status: DC | PRN

## 2018-07-01 MED ORDER — DEXAMETHASONE SODIUM PHOSPHATE 10 MG/ML IJ SOLN
INTRAMUSCULAR | Status: AC
  Filled 2018-07-01: qty 1

## 2018-07-01 MED ORDER — PHENYLEPHRINE 40 MCG/ML (10ML) SYRINGE FOR IV PUSH (FOR BLOOD PRESSURE SUPPORT)
PREFILLED_SYRINGE | INTRAVENOUS | Status: DC | PRN
  Administered 2018-07-01 (×4): 80 ug via INTRAVENOUS
  Administered 2018-07-01: 120 ug via INTRAVENOUS
  Administered 2018-07-01: 80 ug via INTRAVENOUS

## 2018-07-01 MED ORDER — ONDANSETRON HCL 4 MG/2ML IJ SOLN
INTRAMUSCULAR | Status: DC | PRN
  Administered 2018-07-01: 4 mg via INTRAVENOUS

## 2018-07-01 MED ORDER — PHENOL 1.4 % MT LIQD: 1.0000 | OROMUCOSAL | Status: DC | PRN

## 2018-07-01 MED ORDER — MIDAZOLAM HCL 2 MG/2ML IJ SOLN
INTRAMUSCULAR | Status: AC
  Filled 2018-07-01: qty 2

## 2018-07-01 MED ORDER — PROPOFOL 10 MG/ML IV BOLUS
INTRAVENOUS | Status: AC
  Filled 2018-07-01: qty 40

## 2018-07-01 MED ORDER — MECLIZINE HCL 25 MG PO TABS
25.0000 mg | ORAL_TABLET | Freq: Two times a day (BID) | ORAL | Status: DC
  Administered 2018-07-01 – 2018-07-06 (×10): 25 mg via ORAL
  Filled 2018-07-01 (×11): qty 1

## 2018-07-01 MED ORDER — OXYCODONE HCL 5 MG/5ML PO SOLN
5.0000 mg | Freq: Once | ORAL | Status: DC | PRN
  Filled 2018-07-01: qty 5

## 2018-07-01 MED ORDER — STERILE WATER FOR IRRIGATION IR SOLN
Status: DC | PRN
  Administered 2018-07-01: 2000 mL

## 2018-07-01 MED ORDER — FENTANYL CITRATE (PF) 100 MCG/2ML IJ SOLN
INTRAMUSCULAR | Status: DC | PRN
  Administered 2018-07-01: 100 ug via INTRAVENOUS

## 2018-07-01 MED ORDER — DIPHENHYDRAMINE HCL 12.5 MG/5ML PO ELIX: 12.5000 mg | ORAL_SOLUTION | ORAL | Status: DC | PRN

## 2018-07-01 MED ORDER — HYDROCODONE-ACETAMINOPHEN 5-325 MG PO TABS
1.0000 | ORAL_TABLET | ORAL | Status: DC | PRN
  Administered 2018-07-01 (×2): 2 via ORAL
  Administered 2018-07-02: 1 via ORAL
  Administered 2018-07-02 (×2): 2 via ORAL
  Administered 2018-07-03 – 2018-07-04 (×4): 1 via ORAL
  Administered 2018-07-05 – 2018-07-06 (×4): 2 via ORAL
  Filled 2018-07-01 (×2): qty 1
  Filled 2018-07-01 (×7): qty 2
  Filled 2018-07-01: qty 1
  Filled 2018-07-01: qty 2
  Filled 2018-07-01: qty 1
  Filled 2018-07-01: qty 2

## 2018-07-01 MED ORDER — POLYETHYLENE GLYCOL 3350 17 G PO PACK: 17.0000 g | PACK | Freq: Every day | ORAL | Status: DC | PRN

## 2018-07-01 MED ORDER — ACETAMINOPHEN 10 MG/ML IV SOLN
1000.0000 mg | Freq: Four times a day (QID) | INTRAVENOUS | Status: DC
  Administered 2018-07-01: 1000 mg via INTRAVENOUS
  Filled 2018-07-01: qty 100

## 2018-07-01 MED ORDER — MIDAZOLAM HCL 2 MG/2ML IJ SOLN
INTRAMUSCULAR | Status: DC | PRN
  Administered 2018-07-01: 2 mg via INTRAVENOUS

## 2018-07-01 MED ORDER — ONDANSETRON HCL 4 MG PO TABS: 4.0000 mg | ORAL_TABLET | Freq: Four times a day (QID) | ORAL | Status: DC | PRN

## 2018-07-01 MED ORDER — METOCLOPRAMIDE HCL 5 MG PO TABS: 5.0000 mg | ORAL_TABLET | Freq: Three times a day (TID) | ORAL | Status: DC | PRN

## 2018-07-01 MED ORDER — ONDANSETRON HCL 4 MG/2ML IJ SOLN: 4.0000 mg | Freq: Once | INTRAMUSCULAR | Status: DC | PRN

## 2018-07-01 MED ORDER — DOCUSATE SODIUM 100 MG PO CAPS
100.0000 mg | ORAL_CAPSULE | Freq: Two times a day (BID) | ORAL | Status: DC
  Administered 2018-07-01 – 2018-07-06 (×10): 100 mg via ORAL
  Filled 2018-07-01 (×11): qty 1

## 2018-07-01 MED ORDER — SODIUM CHLORIDE 0.9 % IV SOLN
INTRAVENOUS | Status: DC
  Administered 2018-07-01: 19:00:00 via INTRAVENOUS

## 2018-07-01 MED ORDER — AMLODIPINE BESYLATE 10 MG PO TABS
10.0000 mg | ORAL_TABLET | Freq: Every day | ORAL | Status: DC
  Administered 2018-07-01 – 2018-07-05 (×5): 10 mg via ORAL
  Filled 2018-07-01 (×6): qty 1

## 2018-07-01 MED ORDER — MORPHINE SULFATE (PF) 2 MG/ML IV SOLN: 0.5000 mg | INTRAVENOUS | Status: DC | PRN

## 2018-07-01 MED ORDER — LEVOTHYROXINE SODIUM 50 MCG PO TABS
75.0000 ug | ORAL_TABLET | Freq: Every day | ORAL | Status: DC
  Administered 2018-07-02 – 2018-07-06 (×5): 75 ug via ORAL
  Filled 2018-07-01 (×5): qty 1

## 2018-07-01 MED ORDER — CEFAZOLIN SODIUM-DEXTROSE 2-4 GM/100ML-% IV SOLN
2.0000 g | INTRAVENOUS | Status: AC
  Administered 2018-07-01: 2 g via INTRAVENOUS
  Filled 2018-07-01: qty 100

## 2018-07-01 MED ORDER — PROPOFOL 500 MG/50ML IV EMUL
INTRAVENOUS | Status: DC | PRN
  Administered 2018-07-01: 75 ug/kg/min via INTRAVENOUS

## 2018-07-01 MED ORDER — TRANEXAMIC ACID-NACL 1000-0.7 MG/100ML-% IV SOLN
1000.0000 mg | Freq: Once | INTRAVENOUS | Status: AC
  Administered 2018-07-01: 1000 mg via INTRAVENOUS
  Filled 2018-07-01: qty 100

## 2018-07-01 MED ORDER — ACETAMINOPHEN 500 MG PO TABS
500.0000 mg | ORAL_TABLET | Freq: Four times a day (QID) | ORAL | Status: AC
  Administered 2018-07-02: 500 mg via ORAL
  Filled 2018-07-01: qty 1

## 2018-07-01 MED ORDER — METHOCARBAMOL 500 MG IVPB - SIMPLE MED
500.0000 mg | Freq: Four times a day (QID) | INTRAVENOUS | Status: DC | PRN
  Filled 2018-07-01 (×2): qty 50

## 2018-07-01 MED ORDER — MENTHOL 3 MG MT LOZG
1.0000 | LOZENGE | OROMUCOSAL | Status: DC | PRN
  Filled 2018-07-01: qty 9

## 2018-07-01 MED ORDER — HYDROCODONE-ACETAMINOPHEN 7.5-325 MG PO TABS: 1.0000 | ORAL_TABLET | ORAL | Status: DC | PRN

## 2018-07-01 MED ORDER — ONDANSETRON HCL 4 MG/2ML IJ SOLN: 4.0000 mg | Freq: Four times a day (QID) | INTRAMUSCULAR | Status: DC | PRN

## 2018-07-01 MED ORDER — DEXAMETHASONE SODIUM PHOSPHATE 10 MG/ML IJ SOLN
8.0000 mg | Freq: Once | INTRAMUSCULAR | Status: AC
  Administered 2018-07-01: 10 mg via INTRAVENOUS

## 2018-07-01 SURGICAL SUPPLY — 41 items
BAG DECANTER FOR FLEXI CONT (MISCELLANEOUS) ×2 IMPLANT
BAG ZIPLOCK 12X15 (MISCELLANEOUS) IMPLANT
BLADE SAG 18X100X1.27 (BLADE) ×2 IMPLANT
BLADE SURG SZ10 CARB STEEL (BLADE) ×4 IMPLANT
COVER PERINEAL POST (MISCELLANEOUS) ×2 IMPLANT
COVER SURGICAL LIGHT HANDLE (MISCELLANEOUS) ×2 IMPLANT
COVER WAND RF STERILE (DRAPES) IMPLANT
CUP ACET PINNACLE SECTR 58MM (Hips) ×1 IMPLANT
DECANTER SPIKE VIAL GLASS SM (MISCELLANEOUS) ×2 IMPLANT
DRAPE STERI IOBAN 125X83 (DRAPES) ×2 IMPLANT
DRAPE U-SHAPE 47X51 STRL (DRAPES) ×4 IMPLANT
DRSG ADAPTIC 3X8 NADH LF (GAUZE/BANDAGES/DRESSINGS) ×2 IMPLANT
DRSG MEPILEX BORDER 4X4 (GAUZE/BANDAGES/DRESSINGS) ×2 IMPLANT
DRSG MEPILEX BORDER 4X8 (GAUZE/BANDAGES/DRESSINGS) ×2 IMPLANT
DURAPREP 26ML APPLICATOR (WOUND CARE) ×2 IMPLANT
ELECT REM PT RETURN 15FT ADLT (MISCELLANEOUS) ×2 IMPLANT
EVACUATOR 1/8 PVC DRAIN (DRAIN) ×2 IMPLANT
GLOVE BIO SURGEON STRL SZ7 (GLOVE) ×2 IMPLANT
GLOVE BIO SURGEON STRL SZ8 (GLOVE) ×2 IMPLANT
GLOVE BIOGEL PI IND STRL 7.0 (GLOVE) ×1 IMPLANT
GLOVE BIOGEL PI IND STRL 8 (GLOVE) ×1 IMPLANT
GLOVE BIOGEL PI INDICATOR 7.0 (GLOVE) ×1
GLOVE BIOGEL PI INDICATOR 8 (GLOVE) ×1
GOWN STRL REUS W/TWL LRG LVL3 (GOWN DISPOSABLE) ×4 IMPLANT
HEAD CERAMIC 36 PLUS5 (Hips) ×2 IMPLANT
HOLDER FOLEY CATH W/STRAP (MISCELLANEOUS) ×2 IMPLANT
LINER MARATHON NEUT +4X58X36 (Hips) ×2 IMPLANT
MANIFOLD NEPTUNE II (INSTRUMENTS) ×2 IMPLANT
PACK ANTERIOR HIP CUSTOM (KITS) ×2 IMPLANT
PINNACLE SECTOR CUP 58MM (Hips) ×2 IMPLANT
STEM FEM ACTIS HIGH SZ7 (Stem) ×2 IMPLANT
STRIP CLOSURE SKIN 1/2X4 (GAUZE/BANDAGES/DRESSINGS) ×2 IMPLANT
SUT ETHIBOND NAB CT1 #1 30IN (SUTURE) ×2 IMPLANT
SUT MNCRL AB 4-0 PS2 18 (SUTURE) ×2 IMPLANT
SUT STRATAFIX 0 PDS 27 VIOLET (SUTURE) ×2
SUT VIC AB 2-0 CT1 27 (SUTURE) ×2
SUT VIC AB 2-0 CT1 TAPERPNT 27 (SUTURE) ×2 IMPLANT
SUTURE STRATFX 0 PDS 27 VIOLET (SUTURE) ×1 IMPLANT
SYR 50ML LL SCALE MARK (SYRINGE) IMPLANT
TRAY FOLEY MTR SLVR 16FR STAT (SET/KITS/TRAYS/PACK) ×2 IMPLANT
YANKAUER SUCT BULB TIP 10FT TU (MISCELLANEOUS) ×2 IMPLANT

## 2018-07-01 NOTE — Evaluation (Signed)
Physical Therapy Evaluation Patient Details Name: Douglas Brewer. MRN: [id number] DOB: 03/21/1946 Today's Date: 07/01/2018   History of Present Illness  73 yo male s/p L DA-THA on 07/01/18. PMH includes vasovagal syncope, anxiety, HTN, vertigo.   Clinical Impression  Pt presents with mild L hip pain, difficulty performing bed mobility, increased time and effort to perform mobility tasks, and decreased tolerance for ambulation due to post-surgical weakness and dizziness. BP and HR WNL. Pt to benefit from acute PT to address deficits. Pt ambulated 70 ft with min guard assist, verbal cuing for form and safety provided. Pt educated on ankle pumps (20/hour) to perform this afternoon/evening to increase circulation, to pt's tolerance and limited by pain. PT to progress mobility as tolerated, and will continue to follow acutely.        Follow Up Recommendations Follow surgeon's recommendation for DC plan and follow-up therapies;Supervision for mobility/OOB(HEP )    Equipment Recommendations  3in1 (PT)(possibly will need, may or may not have access to one)    Recommendations for Other Services       Precautions / Restrictions Precautions Precautions: Fall Restrictions Weight Bearing Restrictions: No Other Position/Activity Restrictions: WBAT       Mobility  Bed Mobility Overal bed mobility: Needs Assistance Bed Mobility: Supine to Sit;Sit to Supine     Supine to sit: Min guard;HOB elevated;Min assist Sit to supine: Min assist;HOB elevated   General bed mobility comments: Min assist for supine<>sit for LLE lifting and translation to and from EOB. Verbal cuing for sequencing.   Transfers Overall transfer level: Needs assistance Equipment used: Rolling walker (2 wheeled) Transfers: Sit to/from Stand Sit to Stand: Min guard;From elevated surface         General transfer comment: Min guard for safety. Verbal cuing for hand placement.   Ambulation/Gait Ambulation/Gait  assistance: Min guard Gait Distance (Feet): 70 Feet Assistive device: Rolling walker (2 wheeled) Gait Pattern/deviations: Step-to pattern;Decreased stance time - left;Decreased weight shift to left;Antalgic;Trunk flexed Gait velocity: decr    General Gait Details: Min guard for safety. Verbal cuing for sequencing with step-to gait, upright posture, turning with Rw. Prior to sitting EOB to turn to supine, pt with pallor and reporting dizziness when asked. Pt assisted to supine quickly, and put in ~10* trendelenburg position with boost function to increase venous return. BP and HR 126/76 and 80 bpm respectively. Pt WNL after approximately 2 minutes of recovery.    Stairs            Wheelchair Mobility    Modified Rankin (Stroke Patients Only)       Balance Overall balance assessment: Mild deficits observed, not formally tested                                           Pertinent Vitals/Pain Pain Assessment: 0-10 Pain Score: 1  Pain Location: L hip  Pain Descriptors / Indicators: Sore Pain Intervention(s): Limited activity within patient's tolerance;Repositioned;Monitored during session    Home Living Family/patient expects to be discharged to:: Private residence Living Arrangements: Spouse/significant other Available Help at Discharge: Family;Available 24 hours/day(wife, and son lives closeby) Type of Home: House Home Access: Stairs to enter Entrance Stairs-Rails: None Entrance Stairs-Number of Steps: 4 Home Layout: One level Home Equipment: Environmental consultant - 2 wheels;Cane - single point Additional Comments: Pt is unsure if he has access to [facility], pt's son checking tonight  Prior Function Level of Independence: Independent with assistive device(s)         Comments: Pt reports using cane for over 1 year due to L hip pain.      Hand Dominance   Dominant Hand: Right    Extremity/Trunk Assessment   Upper Extremity Assessment Upper Extremity Assessment:  Overall WFL for tasks assessed    Lower Extremity Assessment Lower Extremity Assessment: Overall WFL for tasks assessed;LLE deficits/detail LLE Deficits / Details: suspected post-surgical hip weakness; able to perform ankle pumps, quad sets, heel slides LLE Sensation: WNL    Cervical / Trunk Assessment Cervical / Trunk Assessment: Normal  Communication   Communication: No difficulties  Cognition Arousal/Alertness: Awake/alert Behavior During Therapy: WFL for tasks assessed/performed Overall Cognitive Status: Within Functional Limits for tasks assessed                                        General Comments      Exercises     Assessment/Plan    PT Assessment Patient needs continued PT services  PT Problem List Decreased strength;Pain;Decreased activity tolerance;Decreased knowledge of use of DME;Decreased balance;Decreased mobility       PT Treatment Interventions DME instruction;Therapeutic activities;Gait training;Therapeutic exercise;Patient/family education;Balance training;Stair training;Functional mobility training    PT Goals (Current goals can be found in the Care Plan section)  Acute Rehab PT Goals Patient Stated Goal: walk without pain  PT Goal Formulation: With patient Time For Goal Achievement: 07/08/18 Potential to Achieve Goals: Good    Frequency 7X/week   Barriers to discharge        Co-evaluation               AM-PAC PT "6 Clicks" Mobility  Outcome Measure Help needed turning from your back to your side while in a flat bed without using bedrails?: A Little Help needed moving from lying on your back to sitting on the side of a flat bed without using bedrails?: A Little Help needed moving to and from a bed to a chair (including a wheelchair)?: A Little Help needed standing up from a chair using your arms (e.g., wheelchair or bedside chair)?: A Little Help needed to walk in hospital room?: A Little Help needed climbing 3-5  steps with a railing? : A Little 6 Click Score: 18    End of Session Equipment Utilized During Treatment: Gait belt Activity Tolerance: Patient tolerated treatment well;Treatment limited secondary to medical complications (Comment)(dizziness) Patient left: in bed;with bed alarm set;with call bell/phone within reach;with SCD's reapplied Nurse Communication: Mobility status;Other (comment)(dizziness post-ambulation) PT Visit Diagnosis: Other abnormalities of gait and mobility (R26.89);Difficulty in walking, not elsewhere classified (R26.2)    Time: 6195-0932 PT Time Calculation (min) (ACUTE ONLY): 26 min   Charges:   PT Evaluation $PT Eval Low Complexity: 1 Low PT Treatments $Gait Training: 8-22 mins       Nicola Police, PT Acute Rehabilitation Services Pager [phone]  Office [phone]   Douglas Brewer 07/01/2018, 8:06 PM

## 2018-07-01 NOTE — Plan of Care (Signed)
  Problem: Education: Goal: Knowledge of the prescribed therapeutic regimen will improve Outcome: Progressing Goal: Understanding of discharge needs will improve Outcome: Progressing Goal: Individualized Educational Video(s) Outcome: Progressing   

## 2018-07-01 NOTE — Plan of Care (Signed)
  Problem: Education: Goal: Knowledge of the prescribed therapeutic regimen will improve 07/01/2018 2302 by Kizzie Bane, RN Outcome: Progressing 07/01/2018 2302 by Kizzie Bane, RN Outcome: Progressing Goal: Understanding of discharge needs will improve 07/01/2018 2302 by Kizzie Bane, RN Outcome: Progressing 07/01/2018 2302 by Kizzie Bane, RN Outcome: Progressing Goal: Individualized Educational Video(s) 07/01/2018 2302 by Kizzie Bane, RN Outcome: Progressing 07/01/2018 2302 by Kizzie Bane, RN Outcome: Progressing

## 2018-07-01 NOTE — Anesthesia Postprocedure Evaluation (Signed)
Anesthesia Post Note  Patient: Douglas Brewer.  Procedure(s) Performed: TOTAL HIP ARTHROPLASTY ANTERIOR APPROACH (Left Hip)     Patient location during evaluation: PACU Anesthesia Type: Spinal Level of consciousness: awake and alert Pain management: pain level controlled Vital Signs Assessment: post-procedure vital signs reviewed and stable Respiratory status: spontaneous breathing and respiratory function stable Cardiovascular status: blood pressure returned to baseline and stable Postop Assessment: spinal receding and no apparent nausea or vomiting Anesthetic complications: no    Last Vitals:  Vitals:   07/01/18 1515 07/01/18 1530  BP: (!) 145/78 (!) 155/96  Pulse: 61 66  Resp: 12 17  Temp:    SpO2: 100% 99%    Last Pain:  Vitals:   07/01/18 1445  TempSrc:   PainSc: 0-No pain                 Beryle Lathe

## 2018-07-01 NOTE — Transfer of Care (Signed)
Immediate Anesthesia Transfer of Care Note  Patient: Douglas Brewer.  Procedure(s) Performed: TOTAL HIP ARTHROPLASTY ANTERIOR APPROACH (Left Hip)  Patient Location: PACU  Anesthesia Type:Spinal  Level of Consciousness: awake, alert  and oriented  Airway & Oxygen Therapy: Patient Spontanous Breathing and Patient connected to face mask oxygen  Post-op Assessment: Report given to RN and Post -op Vital signs reviewed and stable  Post vital signs: Reviewed and stable  Last Vitals:  Vitals Value Taken Time  BP 126/76 07/01/2018  2:43 PM  Temp    Pulse 66 07/01/2018  2:43 PM  Resp 12 07/01/2018  2:43 PM  SpO2 96 % 07/01/2018  2:43 PM  Vitals shown include unvalidated device data.  Last Pain:  Vitals:   07/01/18 1147  TempSrc:   PainSc: 9          Complications: No apparent anesthesia complications

## 2018-07-01 NOTE — Anesthesia Procedure Notes (Signed)
Spinal  Patient location during procedure: OR End time: 07/01/2018 12:56 PM Staffing Resident/CRNA: Noralyn Pick D, CRNA Performed: resident/CRNA  Preanesthetic Checklist Completed: patient identified, site marked, surgical consent, pre-op evaluation, timeout performed, IV checked, risks and benefits discussed and monitors and equipment checked Spinal Block Patient position: sitting Prep: Betadine Patient monitoring: heart rate, continuous pulse ox and blood pressure Approach: midline Location: L2-3 Injection technique: single-shot Needle Needle type: Sprotte  Needle gauge: 24 G Needle length: 9 cm Assessment Sensory level: T6 Additional Notes Expiration date of kit checked and confirmed. Patient tolerated procedure well, without complications.

## 2018-07-01 NOTE — Discharge Instructions (Signed)
°Dr. Frank Aluisio °Total Joint Specialist °[facility] °[address] °[place], [place] [address] °[phone] ° °ANTERIOR APPROACH TOTAL HIP REPLACEMENT POSTOPERATIVE DIRECTIONS ° ° °Hip Rehabilitation, Guidelines Following Surgery  °The results of a hip operation are greatly improved after range of motion and muscle strengthening exercises. Follow all safety measures which are given to protect your hip. If any of these exercises cause increased pain or swelling in your joint, decrease the amount until you are comfortable again. Then slowly increase the exercises. Call your caregiver if you have problems or questions.  ° °HOME CARE INSTRUCTIONS  °• Remove items at home which could result in a fall. This includes throw rugs or furniture in walking pathways.  °· ICE to the affected hip every three hours for 30 minutes at a time and then as needed for pain and swelling.  Continue to use ice on the hip for pain and swelling from surgery. You may notice swelling that will progress down to the foot and ankle.  This is normal after surgery.  Elevate the leg when you are not up walking on it.   °· Continue to use the breathing machine which will help keep your temperature down.  It is common for your temperature to cycle up and down following surgery, especially at night when you are not up moving around and exerting yourself.  The breathing machine keeps your lungs expanded and your temperature down. ° °DIET °You may resume your previous home diet once your are discharged from the hospital. ° °DRESSING / WOUND CARE / SHOWERING °You may shower 3 days after surgery, but keep the wounds dry during showering.  You may use an occlusive plastic wrap (Press'n Seal for example), NO SOAKING/SUBMERGING IN THE BATHTUB.  If the bandage gets wet, change with a clean dry gauze.  If the incision gets wet, pat the wound dry with a clean towel. °You may start showering once you are discharged home but do not submerge the  incision under water. Just pat the incision dry and apply a dry gauze dressing on daily. °Change the surgical dressing daily and reapply a dry dressing each time. ° °ACTIVITY °Walk with your walker as instructed. °Use walker as long as suggested by your caregivers. °Avoid periods of inactivity such as sitting longer than an hour when not asleep. This helps prevent blood clots.  °You may resume a sexual relationship in one month or when given the OK by your doctor.  °You may return to work once you are cleared by your doctor.  °Do not drive a car for 6 weeks or until released by you surgeon.  °Do not drive while taking narcotics. ° °WEIGHT BEARING °Weight bearing as tolerated with assist device (walker, cane, etc) as directed, use it as long as suggested by your surgeon or therapist, typically at least 4-6 weeks. ° °POSTOPERATIVE CONSTIPATION PROTOCOL °Constipation - defined medically as fewer than three stools per week and severe constipation as less than one stool per week. ° °One of the most common issues patients have following surgery is constipation.  Even if you have a regular bowel pattern at home, your normal regimen is likely to be disrupted due to multiple reasons following surgery.  Combination of anesthesia, postoperative narcotics, change in appetite and fluid intake all can affect your bowels.  In order to avoid complications following surgery, here are some recommendations in order to help you during your recovery period. ° °Colace (docusate) - Pick up an over-the-counter form   of Colace or another stool softener and take twice a day as long as you are requiring postoperative pain medications.  Take with a full glass of water daily.  If you experience loose stools or diarrhea, hold the colace until you stool forms back up.  If your symptoms do not get better within 1 week or if they get worse, check with your doctor. ° °Dulcolax (bisacodyl) - Pick up over-the-counter and take as directed by the product  packaging as needed to assist with the movement of your bowels.  Take with a full glass of water.  Use this product as needed if not relieved by Colace only.  ° °MiraLax (polyethylene glycol) - Pick up over-the-counter to have on hand.  MiraLax is a solution that will increase the amount of water in your bowels to assist with bowel movements.  Take as directed and can mix with a glass of water, juice, soda, coffee, or tea.  Take if you go more than two days without a movement. °Do not use MiraLax more than once per day. Call your doctor if you are still constipated or irregular after using this medication for 7 days in a row. ° °If you continue to have problems with postoperative constipation, please contact the office for further assistance and recommendations.  If you experience "the worst abdominal pain ever" or develop nausea or vomiting, please contact the office immediatly for further recommendations for treatment. ° °ITCHING ° If you experience itching with your medications, try taking only a single pain pill, or even half a pain pill at a time.  You can also use Benadryl over the counter for itching or also to help with sleep.  ° °TED HOSE STOCKINGS °Wear the elastic stockings on both legs for three weeks following surgery during the day but you may remove then at night for sleeping. ° °MEDICATIONS °See your medication summary on the “After Visit Summary” that the nursing staff will review with you prior to discharge.  You may have some home medications which will be placed on hold until you complete the course of blood thinner medication.  It is important for you to complete the blood thinner medication as prescribed by your surgeon.  Continue your approved medications as instructed at time of discharge. ° °PRECAUTIONS °If you experience chest pain or shortness of breath - call 911 immediately for transfer to the hospital emergency department.  °If you develop a fever greater that 101 F, purulent drainage  from wound, increased redness or drainage from wound, foul odor from the wound/dressing, or calf pain - CONTACT YOUR SURGEON.   °                                                °FOLLOW-UP APPOINTMENTS °Make sure you keep all of your appointments after your operation with your surgeon and caregivers. You should call the office at the above phone number and make an appointment for approximately two weeks after the date of your surgery or on the date instructed by your surgeon outlined in the "After Visit Summary". ° °RANGE OF MOTION AND STRENGTHENING EXERCISES  °These exercises are designed to help you keep full movement of your hip joint. Follow your caregiver's or physical therapist's instructions. Perform all exercises about fifteen times, three times per day or as directed. Exercise both hips, even if you have   had only one joint replacement. These exercises can be done on a training (exercise) mat, on the floor, on a table or on a bed. Use whatever works the best and is most comfortable for you. Use music or television while you are exercising so that the exercises are a pleasant break in your day. This will make your life better with the exercises acting as a break in routine you can look forward to.  °• Lying on your back, slowly slide your foot toward your buttocks, raising your knee up off the floor. Then slowly slide your foot back down until your leg is straight again.  °• Lying on your back spread your legs as far apart as you can without causing discomfort.  °• Lying on your side, raise your upper leg and foot straight up from the floor as far as is comfortable. Slowly lower the leg and repeat.  °• Lying on your back, tighten up the muscle in the front of your thigh (quadriceps muscles). You can do this by keeping your leg straight and trying to raise your heel off the floor. This helps strengthen the largest muscle supporting your knee.  °• Lying on your back, tighten up the muscles of your buttocks both  with the legs straight and with the knee bent at a comfortable angle while keeping your heel on the floor.  ° °IF YOU ARE TRANSFERRED TO A SKILLED REHAB FACILITY °If the patient is transferred to a skilled rehab facility following release from the hospital, a list of the current medications will be sent to the facility for the patient to continue.  When discharged from the skilled rehab facility, please have the facility set up the patient's Home Health Physical Therapy prior to being released. Also, the skilled facility will be responsible for providing the patient with their medications at time of release from the facility to include their pain medication, the muscle relaxants, and their blood thinner medication. If the patient is still at the rehab facility at time of the two week follow up appointment, the skilled rehab facility will also need to assist the patient in arranging follow up appointment in our office and any transportation needs. ° °MAKE SURE YOU:  °• Understand these instructions.  °• Get help right away if you are not doing well or get worse.  ° ° °Pick up stool softner and laxative for home use following surgery while on pain medications. °Do not submerge incision under water. °Please use good hand washing techniques while changing dressing each day. °May shower starting three days after surgery. °Please use a clean towel to pat the incision dry following showers. °Continue to use ice for pain and swelling after surgery. °Do not use any lotions or creams on the incision until instructed by your surgeon. ° °

## 2018-07-01 NOTE — Interval H&P Note (Signed)
History and Physical Interval Note:  07/01/2018 11:40 AM  Douglas Coderussell R Loper Jr.  has presented today for surgery, with the diagnosis of left hip osteoarthritis  The various methods of treatment have been discussed with the patient and family. After consideration of risks, benefits and other options for treatment, the patient has consented to  Procedure(s) with comments: TOTAL HIP ARTHROPLASTY ANTERIOR APPROACH (Left) - 100min as a surgical intervention .  The patient's history has been reviewed, patient examined, no change in status, stable for surgery.  I have reviewed the patient's chart and labs.  Questions were answered to the patient's satisfaction.     [name]

## 2018-07-01 NOTE — Op Note (Signed)
OPERATIVE REPORT- TOTAL HIP ARTHROPLASTY   PREOPERATIVE DIAGNOSIS: Osteoarthritis of the Left hip.   POSTOPERATIVE DIAGNOSIS: Osteoarthritis of the Left  hip.   PROCEDURE: Left total hip arthroplasty, anterior approach.   SURGEON: Ollen Gross, MD   ASSISTANT: Arther Abbott, PA-C  ANESTHESIA:  Spinal  ESTIMATED BLOOD LOSS:-350 mL    DRAINS: Hemovac x1.   COMPLICATIONS: None   CONDITION: PACU - hemodynamically stable.   BRIEF CLINICAL NOTE: Douglas Brewer. is a 73 y.o. male who has advanced end-  stage arthritis of their Left  hip with progressively worsening pain and  dysfunction.The patient has failed nonoperative management and presents for  total hip arthroplasty.   PROCEDURE IN DETAIL: After successful administration of spinal  anesthetic, the traction boots for the [facility] bed were placed on both  feet and the patient was placed onto the [facility] bed, boots placed into the leg  holders. The Left hip was then isolated from the perineum with plastic  drapes and prepped and draped in the usual sterile fashion. ASIS and  greater trochanter were marked and a oblique incision was made, starting  at about 1 cm lateral and 2 cm distal to the ASIS and coursing towards  the anterior cortex of the femur. The skin was cut with a 10 blade  through subcutaneous tissue to the level of the fascia overlying the  tensor fascia lata muscle. The fascia was then incised in line with the  incision at the junction of the anterior third and posterior 2/3rd. The  muscle was teased off the fascia and then the interval between the TFL  and the rectus was developed. The Hohmann retractor was then placed at  the top of the femoral neck over the capsule. The vessels overlying the  capsule were cauterized and the fat on top of the capsule was removed.  A Hohmann retractor was then placed anterior underneath the rectus  femoris to give exposure to the entire anterior capsule. A T-shaped   capsulotomy was performed. The edges were tagged and the femoral head  was identified.       Osteophytes are removed off the superior acetabulum.  The femoral neck was then cut in situ with an oscillating saw. Traction  was then applied to the left lower extremity utilizing the [facility]  traction. The femoral head was then removed. Retractors were placed  around the acetabulum and then circumferential removal of the labrum was  performed. Osteophytes were also removed. Reaming starts at 51 mm to  medialize and  Increased in 2 mm increments to 57 mm. We reamed in  approximately 40 degrees of abduction, 20 degrees anteversion. A 58 mm  pinnacle acetabular shell was then impacted in anatomic position under  fluoroscopic guidance with excellent purchase. We did not need to place  any additional dome screws. A 36 mm neutral + 4 marathon liner was then  placed into the acetabular shell.       The femoral lift was then placed along the lateral aspect of the femur  just distal to the vastus ridge. The leg was  externally rotated and capsule  was stripped off the inferior aspect of the femoral neck down to the  level of the lesser trochanter, this was done with electrocautery. The femur was lifted after this was performed. The  leg was then placed in an extended and adducted position essentially delivering the femur. We also removed the capsule superiorly and the piriformis from the  piriformis fossa to gain excellent exposure of the  proximal femur. Rongeur was used to remove some cancellous bone to get  into the lateral portion of the proximal femur for placement of the  initial starter reamer. The starter broaches was placed  the starter broach  and was shown to go down the center of the canal. Broaching  with the Actis system was then performed starting at size 0  coursing  Up to size 7. A size 7 had excellent torsional and rotational  and axial stability. The trial high offset neck was then placed   with a 36 + 5 trial head. The hip was then reduced. We confirmed that  the stem was in the canal both on AP and lateral x-rays. It also has excellent sizing. The hip was reduced with outstanding stability through full extension and full external rotation.. AP pelvis was taken and the leg lengths were measured and found to be equal. Hip was then dislocated again and the femoral head and neck removed. The  femoral broach was removed. Size 7 Actis stem with a high offset  neck was then impacted into the femur following native anteversion. Has  excellent purchase in the canal. Excellent torsional and rotational and  axial stability. It is confirmed to be in the canal on AP and lateral  fluoroscopic views. The 36 + 5 ceramic head was placed and the hip  reduced with outstanding stability. Again AP pelvis was taken and it  confirmed that the leg lengths were equal. The wound was then copiously  irrigated with saline solution and the capsule reattached and repaired  with Ethibond suture. 30 ml of .25% Bupivicaine was  injected into the capsule and into the edge of the tensor fascia lata as well as subcutaneous tissue. The fascia overlying the tensor fascia lata was then closed with a running #1 V-Loc. Subcu was closed with interrupted 2-0 Vicryl and subcuticular running 4-0 Monocryl. Incision was cleaned  and dried. Steri-Strips and a bulky sterile dressing applied. Hemovac  drain was hooked to suction and then the patient was awakened and transported to  recovery in stable condition.        Please note that a surgical assistant was a medical necessity for this procedure to perform it in a safe and expeditious manner. Assistant was necessary to provide appropriate retraction of vital neurovascular structures and to prevent femoral fracture and allow for anatomic placement of the prosthesis.  Ollen Gross, M.D.

## 2018-07-01 NOTE — Anesthesia Procedure Notes (Signed)
Performed by: Alsace Dowd D, CRNA       

## 2018-07-02 ENCOUNTER — Encounter (HOSPITAL_COMMUNITY): Payer: Self-pay | Admitting: Orthopedic Surgery

## 2018-07-02 LAB — BASIC METABOLIC PANEL
Anion gap: 9 (ref 5–15)
BUN: 13 mg/dL (ref 8–23)
CO2: 25 mmol/L (ref 22–32)
Calcium: 8.5 mg/dL — ABNORMAL LOW (ref 8.9–10.3)
Chloride: 103 mmol/L (ref 98–111)
Creatinine, Ser: 0.91 mg/dL (ref 0.61–1.24)
GFR calc non Af Amer: >60 mL/min (ref >60)
Glucose, Bld: 164 mg/dL — ABNORMAL HIGH (ref 70–99)
POTASSIUM: 3.7 mmol/L (ref 3.5–5.1)
Sodium: 137 mmol/L (ref 135–145)

## 2018-07-02 LAB — CBC
HCT: 32.5 % — ABNORMAL LOW (ref 39.0–52.0)
Hemoglobin: 9.5 g/dL — ABNORMAL LOW (ref 13.0–17.0)
MCH: 22.9 pg — ABNORMAL LOW (ref 26.0–34.0)
MCHC: 29.2 g/dL — ABNORMAL LOW (ref 30.0–36.0)
MCV: 78.5 fL — ABNORMAL LOW (ref 80.0–100.0)
Platelets: 277 10*3/uL (ref 150–400)
RBC: 4.14 MIL/uL — ABNORMAL LOW (ref 4.22–5.81)
RDW: 15.6 % — ABNORMAL HIGH (ref 11.5–15.5)
WBC: 15.8 10*3/uL — AB (ref 4.0–10.5)
nRBC: 0 % (ref 0.0–0.2)

## 2018-07-02 MED ORDER — METHOCARBAMOL 500 MG PO TABS: 500.0000 mg | ORAL_TABLET | Freq: Four times a day (QID) | ORAL | 0 refills | Status: DC | PRN

## 2018-07-02 MED ORDER — HYDROCODONE-ACETAMINOPHEN 5-325 MG PO TABS: 1.0000 | ORAL_TABLET | Freq: Four times a day (QID) | ORAL | 0 refills | Status: DC | PRN

## 2018-07-02 MED ORDER — ASPIRIN 325 MG PO TBEC: 325.0000 mg | DELAYED_RELEASE_TABLET | Freq: Two times a day (BID) | ORAL | 0 refills | Status: DC

## 2018-07-02 NOTE — Progress Notes (Signed)
Physical Therapy Treatment Patient Details Name: Douglas Brewer. MRN: [id number] DOB: 01/06/1946 Today's Date: 07/02/2018    History of Present Illness 73 yo male s/p L DA-THA on 07/01/18. PMH includes vasovagal syncope, anxiety, HTN, vertigo.     PT Comments    Pt dizzy, diaphoretic after amb ~ 40'; chair brought  to pt for safety. BP 91/58, HR 55, SpO2=99% on RA. RN made aware. Will see again in pm  Follow Up Recommendations  Follow surgeon's recommendation for DC plan and follow-up therapies;Supervision for mobility/OOB     Equipment Recommendations  3in1 (PT)    Recommendations for Other Services       Precautions / Restrictions Precautions Precautions: Fall Restrictions Weight Bearing Restrictions: No Other Position/Activity Restrictions: WBAT     Mobility  Bed Mobility Overal bed mobility: Needs Assistance Bed Mobility: Supine to Sit     Supine to sit: Min assist     General bed mobility comments: assist with LLE, cues for safety  Transfers Overall transfer level: Needs assistance Equipment used: Rolling walker (2 wheeled) Transfers: Sit to/from Stand Sit to Stand: Min assist         General transfer comment: assist to rise and stabilize, incr time, effortful transition  Ambulation/Gait Ambulation/Gait assistance: Min assist;Min guard Gait Distance (Feet): 50 Feet Assistive device: Rolling walker (2 wheeled) Gait Pattern/deviations: Step-to pattern;Antalgic Gait velocity: decr    General Gait Details: assist to balance and maneuver RW, cues for safety and sequence   Stairs             Wheelchair Mobility    Modified Rankin (Stroke Patients Only)       Balance           Standing balance support: During functional activity;Bilateral upper extremity supported Standing balance-Leahy Scale: Poor Standing balance comment: reliant on UEs                            Cognition Arousal/Alertness: Awake/alert Behavior  During Therapy: WFL for tasks assessed/performed Overall Cognitive Status: Within Functional Limits for tasks assessed                                        Exercises Total Joint Exercises Ankle Circles/Pumps: AROM;Both;10 reps Quad Sets: AROM;Both;10 reps    General Comments        Pertinent Vitals/Pain Pain Assessment: 0-10 Pain Score: 2  Pain Location: L hip  Pain Descriptors / Indicators: Sore Pain Intervention(s): Limited activity within patient's tolerance;Monitored during session;Premedicated before session;Repositioned;Ice applied    Home Living                      Prior Function            PT Goals (current goals can now be found in the care plan section) Acute Rehab PT Goals Patient Stated Goal: walk without pain  PT Goal Formulation: With patient Time For Goal Achievement: 07/08/18 Potential to Achieve Goals: Good Progress towards PT goals: Progressing toward goals    Frequency    7X/week      PT Plan Current plan remains appropriate    Co-evaluation              AM-PAC PT "6 Clicks" Mobility   Outcome Measure  Help needed turning from your back to your side while in a  flat bed without using bedrails?: A Little Help needed moving from lying on your back to sitting on the side of a flat bed without using bedrails?: A Little Help needed moving to and from a bed to a chair (including a wheelchair)?: A Little Help needed standing up from a chair using your arms (e.g., wheelchair or bedside chair)?: A Little Help needed to walk in hospital room?: A Little Help needed climbing 3-5 steps with a railing? : A Little 6 Click Score: 18    End of Session Equipment Utilized During Treatment: Gait belt Activity Tolerance: Patient limited by fatigue;Treatment limited secondary to medical complications (Comment)(decr BP, dizziness) Patient left: with call bell/phone within reach;in chair;with chair alarm set Nurse  Communication: Other (comment)(VS) PT Visit Diagnosis: Other abnormalities of gait and mobility (R26.89);Difficulty in walking, not elsewhere classified (R26.2)     Time: 9373-4287 PT Time Calculation (min) (ACUTE ONLY): 29 min  Charges:  $Gait Training: 8-22 mins $Therapeutic Activity: 8-22 mins                     Drucilla Chalet, PT  Pager: [phone] Acute Rehab Dept [facility]): [phone]   07/02/2018    [facility] 07/02/2018, 10:10 AM

## 2018-07-02 NOTE — Progress Notes (Signed)
07/02/18 1500  PT Visit Information  Last PT Received On 07/02/18  Pt diaphoretic after performing stairs; full LOC after seated in chair; BP 100/60, HR and sats WNL; RN notified and present end of session; will see again in am. Should be ready for d/c if medical issues resolve. Pt son reports he has had episodes of passing out prior to this, sometimes without warning  Assistance Needed +1  History of Present Illness 73 yo male s/p L DA-THA on 07/01/18. PMH includes vasovagal syncope, anxiety, HTN, vertigo.   Subjective Data  Patient Stated Goal walk without pain   Precautions  Precautions Fall  Restrictions  Weight Bearing Restrictions No  Other Position/Activity Restrictions WBAT   Pain Assessment  Pain Assessment 0-10  Pain Score 4  Pain Location L hip   Pain Descriptors / Indicators Sore  Pain Intervention(s) Limited activity within patient's tolerance;Monitored during session;Premedicated before session  Cognition  Arousal/Alertness Awake/alert  Behavior During Therapy WFL for tasks assessed/performed  Overall Cognitive Status Within Functional Limits for tasks assessed  Transfers  Overall transfer level Needs assistance  Equipment used Rolling walker (2 wheeled)  Transfers Sit to/from Stand  Sit to Stand Min assist  General transfer comment assist to rise and stabilize, incr time, effortful transition  Ambulation/Gait  Ambulation/Gait assistance Min assist;Min guard  Gait Distance (Feet) 20 Feet  Assistive device Rolling walker (2 wheeled)  Gait Pattern/deviations Step-to pattern;Antalgic  General Gait Details assist to balance and maneuver RW, cues for safety and sequence  Gait velocity decr   Stairs Yes  Stairs assistance Min assist;+2 safety/equipment  Stair Management No rails;Step to pattern;Backwards;With walker  Number of Stairs 2  General stair comments cues for sequence, incr time needed; diaphoretic after practicing stairs and amb 3", RN aware  Balance   Standing balance support During functional activity;Bilateral upper extremity supported  Standing balance-Leahy Scale Poor  Standing balance comment reliant on UEs  Total Joint Exercises  Ankle Circles/Pumps AROM;Both;10 reps  Quad Sets AROM;Both;10 reps  PT - End of Session  Equipment Utilized During Treatment Gait belt  Activity Tolerance Patient limited by fatigue;Treatment limited secondary to medical complications (Comment) (decr BP, dizziness)  Patient left with call bell/phone within reach;in chair;with chair alarm set  Nurse Communication Other (comment) (VS)   PT - Assessment/Plan  PT Plan Current plan remains appropriate  PT Visit Diagnosis Other abnormalities of gait and mobility (R26.89);Difficulty in walking, not elsewhere classified (R26.2)  PT Frequency (ACUTE ONLY) 7X/week  Follow Up Recommendations Follow surgeon's recommendation for DC plan and follow-up therapies;Supervision for mobility/OOB  PT equipment 3in1 (PT)  AM-PAC PT "6 Clicks" Mobility Outcome Measure (Version 2)  Help needed turning from your back to your side while in a flat bed without using bedrails? 3  Help needed moving from lying on your back to sitting on the side of a flat bed without using bedrails? 3  Help needed moving to and from a bed to a chair (including a wheelchair)? 3  Help needed standing up from a chair using your arms (e.g., wheelchair or bedside chair)? 3  Help needed to walk in hospital room? 3  Help needed climbing 3-5 steps with a railing?  3  6 Click Score 18  Consider Recommendation of Discharge To: Home with [facility]  PT Goal Progression  Progress towards PT goals Not progressing toward goals - comment (LOC after performing stairs, seated in chair)  Acute Rehab PT Goals  PT Goal Formulation With patient  Time  For Goal Achievement 07/08/18  Potential to Achieve Goals Good  PT Time Calculation  PT Start Time (ACUTE ONLY) 1512  PT Stop Time (ACUTE ONLY) 1532  PT Time Calculation  (min) (ACUTE ONLY) 20 min  PT General Charges  $$ ACUTE PT VISIT 1 Visit  PT Treatments  $Gait Training 8-22 mins

## 2018-07-02 NOTE — Progress Notes (Signed)
   Subjective: 1 Day Post-Op Procedure(s) (LRB): TOTAL HIP ARTHROPLASTY ANTERIOR APPROACH (Left) Patient reports pain as mild.   Patient seen in rounds by Dr. Lequita Halt. Patient is well, and has had no acute complaints or problems other than pain in the left hip. No acute issues overnight. Foley catheter removed this AM, positive flatus. Denies CP, SHOB, or calf pain.  We will continue therapy today.   Objective: Vital signs in last 24 hours: Temp:  [97.4 F (36.3 C)-97.9 F (36.6 C)] 97.7 F (36.5 C) (02/13 0543) Pulse Rate:  [61-100] 77 (02/13 0543) Resp:  [12-22] 14 (02/13 0543) BP: (129-179)/(65-104) 149/88 (02/13 0543) SpO2:  [97 %-100 %] 100 % (02/13 0543) Weight:  [108.4 kg] 108.4 kg (02/12 1138)  Intake/Output from previous day:  Intake/Output Summary (Last 24 hours) at 07/02/2018 0723 Last data filed at 07/02/2018 0600 Gross per 24 hour  Intake 4295.35 ml  Output 3905 ml  Net 390.35 ml     Intake/Output this shift: No intake/output data recorded.  Labs: Recent Labs    07/02/18 0523  HGB 9.5*   Recent Labs    07/02/18 0523  WBC 15.8*  RBC 4.14*  HCT 32.5*  PLT 277   Recent Labs    07/02/18 0523  NA 137  K 3.7  CL 103  CO2 25  BUN 13  CREATININE 0.91  GLUCOSE 164*  CALCIUM 8.5*   No results for input(s): LABPT, INR in the last 72 hours.  Exam: General - Patient is Alert and Oriented Extremity - Neurologically intact Sensation intact distally Intact pulses distally Dorsiflexion/Plantar flexion intact Dressing - dressing C/D/I Motor Function - intact, moving foot and toes well on exam.   Past Medical History:  Diagnosis Date  . Anxiety   . Arthritis   . Hypertension   . Hypothyroidism   . Vertigo     Assessment/Plan: 1 Day Post-Op Procedure(s) (LRB): TOTAL HIP ARTHROPLASTY ANTERIOR APPROACH (Left) Principal Problem:   OA (osteoarthritis) of hip  Estimated body mass index is 33.33 kg/m as calculated from the following:   Height  as of this encounter: 5\' 11"  (1.803 m).   Weight as of this encounter: 108.4 kg. Advance diet Up with therapy D/C IV fluids  DVT Prophylaxis - Aspirin Weight bearing as tolerated. D/C O2 and pulse ox and try on room air. Hemovac pulled without difficulty, will begin therapy.  Plan is to go Home after hospital stay. Plan for discharge today if meeting goals with therapy. Patient will do HEP. Follow up in the office in 2 weeks with Dr. Lequita Halt.   Dennie Bible, PA-C Orthopedic Surgery 07/02/2018, 7:23 AM

## 2018-07-02 NOTE — Progress Notes (Signed)
Notified PA Arther Abbott of pt's syncopal episode with PT. BP drop to 100/60. Pt unhurt and in chair. DC plan revised for pt to remain until tomorrow and reevaluate then. Family in agreement.

## 2018-07-03 ENCOUNTER — Other Ambulatory Visit: Payer: Self-pay

## 2018-07-03 DIAGNOSIS — M169 Osteoarthritis of hip, unspecified: Secondary | ICD-10-CM

## 2018-07-03 LAB — BASIC METABOLIC PANEL
Anion gap: 7 (ref 5–15)
BUN: 18 mg/dL (ref 8–23)
CO2: 25 mmol/L (ref 22–32)
CREATININE: 0.88 mg/dL (ref 0.61–1.24)
Calcium: 8.4 mg/dL — ABNORMAL LOW (ref 8.9–10.3)
Chloride: 105 mmol/L (ref 98–111)
GFR calc Af Amer: >60 mL/min (ref >60)
GFR calc non Af Amer: >60 mL/min (ref >60)
Glucose, Bld: 132 mg/dL — ABNORMAL HIGH (ref 70–99)
Potassium: 3.6 mmol/L (ref 3.5–5.1)
Sodium: 137 mmol/L (ref 135–145)

## 2018-07-03 LAB — CBC
HCT: 26.7 % — ABNORMAL LOW (ref 39.0–52.0)
Hemoglobin: 8 g/dL — ABNORMAL LOW (ref 13.0–17.0)
MCH: 23 pg — ABNORMAL LOW (ref 26.0–34.0)
MCHC: 30 g/dL (ref 30.0–36.0)
MCV: 76.7 fL — ABNORMAL LOW (ref 80.0–100.0)
PLATELETS: 285 10*3/uL (ref 150–400)
RBC: 3.48 MIL/uL — ABNORMAL LOW (ref 4.22–5.81)
RDW: 15.9 % — ABNORMAL HIGH (ref 11.5–15.5)
WBC: 18.5 10*3/uL — ABNORMAL HIGH (ref 4.0–10.5)
nRBC: 0 % (ref 0.0–0.2)

## 2018-07-03 LAB — GLUCOSE, CAPILLARY: Glucose-Capillary: 101 mg/dL — ABNORMAL HIGH (ref 70–99)

## 2018-07-03 MED ORDER — SODIUM CHLORIDE 0.9 % IV BOLUS
250.0000 mL | Freq: Once | INTRAVENOUS | Status: AC
  Administered 2018-07-03: 250 mL via INTRAVENOUS

## 2018-07-03 MED ORDER — FERROUS SULFATE 325 (65 FE) MG PO TBEC: 325.0000 mg | DELAYED_RELEASE_TABLET | Freq: Two times a day (BID) | ORAL | 0 refills | Status: DC

## 2018-07-03 NOTE — Consult Note (Addendum)
[facility] Medical Consultation  Douglas Brewer. [id number]:[id number] DOB: 09/29/1945 DOA: 07/01/2018 PCP: Shelly Rubensteinh Park, Angela J, MD   Requesting physician: Douglas Brewer Date of consultation: 2/14 Reason for consultation: syncope  Impression/Recommendations Principal Problem:   OA (osteoarthritis) of hip    1. Syncope: pt long history (he notes 8 years) of recurrent episodes of what appear to be syncope.  He's been seen by cardiology and neurology in the past and undergone extensive testing and workup.  Per chart review, looks like concern was for reflex syncope (or neurally mediated syncope), predominantly vasodepressor type.  Cardiology (2016 notes) did not recommend loop recorder in the past as no bradycardia seen in prior monitoring.  He saw neurology in 10/2014 (care everywhere) who noted that the etiology of his events was unclear.  He's had 2 events during this hospitalization, yesterday during PT evaluation (diaphoretic after performing stairs with LOC after sitting in chair) and then today when he passed out in the wheelchair (pt became pale, sweaty, and woke after Douglas Brewer few seconds).  No clear seizure like activity, no loss of bladder control/tongue biting. Sometimes Douglas Brewer little confused after.   1. 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  It is concerning that episode today occurred while seated, concern for cardiac etiology, but sounds like this is similar to prior and he's had extensive w/u before.  Ddx includes seizures. 2. EKG shows RBBB that appears similar to priors.  QTc slightly prolonged at 469.   3. Will transfer to telemetry floor.   4. Follow orthostatics 5. Encourage slow transition from sitting -> standing.   6. Continue ted hose. 7. Continue to monitor overnight on telemetry, will reevaluated tomorrow and if no additional events, consider d/c with outpatient cards/neuro follow up outpatient.  If recurrent episodes, would consider EEG to help rule out  seizures as well as inpatient consultation with cardiology.   8. Discussed that he should not drive until cleared by physician. 9. We will continue to follow.  Acute Blood Loss Anemia: post op.  transfuse for <7, but would consider transfusion if pt orthostatic or has continued symptoms while in house  Leukocytosis: likely reactive  Left Total Hip Arthroplasty: per surgery HTN: amlodipine, losartan.  BP fluctuating. Hypothyroidism: synthroid  I will followup again tomorrow. Please contact me if I can be of assistance in the meanwhile. Thank you for this consultation.  Chief Complaint: syncope  HPI:  73 year old male with Douglas Brewer history of hypertension, hypothyroidism, anxiety, left hip arthritis, and reflex syncope who presented for Douglas Brewer left total hip arthroplasty.  He underwent surgery on February 12.  Plan was for discharge today, but when patient was being transported in Douglas Brewer wheelchair he had [name] syncopal event.  The patient notes 8 years of recurrent episodes of syncope.  He notes that he had not had any issues in about 5 months, but last week had 4-5 dizzy spells.  He describes that at times he has pressure in his left ear preceding this.  Yesterday when he was working with therapy, he felt like someone was putting him to sleep.  He felt vertigo.  This lasted 15 to 20 seconds.  He denies any chest pain, shortness of breath.  He notes lightheadedness.  The symptoms lasted around 20 seconds and stopped.  Today he was ready to be discharged, but when he was in the wheelchair he became pale and fainted for [name] few seconds.  Medicine was consulted for recurrent syncope.  Family notes that  sometimes after these episodes he may be Douglas Brewer little bit confused.  They deny seeing any clear seizure-like activity, urinary incontinence, or ever biting his tongue.  He has had extensive evaluations in the past by cardiology, neurology, and ENT.  He feels like he is never been given Douglas Brewer clear answer as to what is going on.  He  denies smoking or drinking.  Review of Systems:  Review of systems is negative except as noted per HPI  Past Medical History:  Diagnosis Date  . Anxiety   . Arthritis   . Hypertension   . Hypothyroidism   . Vertigo    Past Surgical History:  Procedure Laterality Date  . TONSILLECTOMY    . TOTAL HIP ARTHROPLASTY Left 07/01/2018   Procedure: TOTAL HIP ARTHROPLASTY ANTERIOR APPROACH;  Surgeon: Ollen Gross, MD;  Location: [facility];  Service: Orthopedics;  Laterality: Left;    Social History:  reports that he has never smoked. He quit smokeless tobacco use about 30 years ago.  His smokeless tobacco use included chew. He reports that he does not drink alcohol or use drugs.  Allergies  Allergen Reactions  . Divalproex Sodium Swelling    Leg swell  . Topamax [Topiramate] Other (See Comments)    Lowers blood pressure    Family History  Problem Relation Age of Onset  . Stroke Mother   . Heart disease Father   . Heart attack Father   . Hypertension Brother     Prior to Admission medications   Medication Sig Start Date End Date Taking? Authorizing Provider  amitriptyline (ELAVIL) 25 MG tablet Take 25 mg by mouth at bedtime.   Yes [provider]  amLODipine (NORVASC) 10 MG tablet Take 10 mg by mouth daily.   Yes [provider]  aspirin 81 MG tablet Take 81 mg by mouth every other day.    Yes [provider]  clonazePAM (KLONOPIN) 1 MG tablet Take 1 mg by mouth at bedtime.    Yes [provider]  diazepam (VALIUM) 5 MG tablet Take 5 mg by mouth every morning.    Yes [provider]  ibuprofen (ADVIL,MOTRIN) 800 MG tablet Take 800 mg by mouth every 8 (eight) hours as needed for moderate pain.   Yes [provider]  levothyroxine (SYNTHROID, LEVOTHROID) 75 MCG tablet Take 75 mcg by mouth daily before breakfast.    Yes [provider]  losartan (COZAAR) 100 MG tablet Take 100 mg by mouth daily.   Yes [provider]  meclizine (ANTIVERT) 25 MG tablet Take 25 mg by mouth 2 (two) times daily.   Yes [provider]  amLODipine (NORVASC) 5 MG tablet Take 2 tablets (10 mg total) by mouth daily. Patient not taking: Reported on 06/11/2018 03/01/15   Laqueta Linden, MD  aspirin EC 325 MG EC tablet Take 1 tablet (325 mg total) by mouth 2 (two) times daily for 20 days. Then take one baby Aspirin (81 mg) once Labarron Durnin day for three weeks.Then discontinue aspirin. 07/02/18 07/22/18  Tommie Ard, PA-C  ferrous sulfate 325 (65 FE) MG EC tablet Take 1 tablet (325 mg total) by mouth 2 (two) times daily for 28 doses. 07/03/18 07/17/18  Edmisten, Lyn Hollingshead, PA  HYDROcodone-acetaminophen (NORCO/VICODIN) 5-325 MG tablet Take 1-2 tablets by mouth every 6 (six) hours as needed for moderate pain (pain score 4-6). 07/02/18   Tommie Ard, PA-C  methocarbamol (ROBAXIN) 500 MG tablet Take 1 tablet (500 mg total)  by mouth every 6 (six) hours as needed for muscle spasms. 07/02/18   Tommie Ard, PA-C   Physical Exam: Blood pressure (!) 168/77, pulse 79, temperature 98.1 F (36.7 C), temperature source Oral, resp. rate 18, height 5\' 11"  (1.803 m), weight 108.4 kg, SpO2 98 %. Vitals:   07/03/18 1429 07/03/18 1707  BP: 121/79 (!) 168/77  Pulse: 67 79  Resp: 16 18  Temp: 98.3 F (36.8 C) 98.1 F (36.7 C)  SpO2: 97% 98%     General:  NAD, awake, alert  Eyes: PERRL  ENT: mmm  Neck: supple  Cardiovascular: RRR, no appreciated mgr  Respiratory: CTAB, unlabored  Abdomen: s/nt/nd  Skin: warm and dry  Musculoskeletal: L hip with intact dressing  Psychiatric: normal mood and affect  Neurologic: CN 2-12 intact, 5/5 strength throughout (except L hip limited to pain/recent surgery), sensation intact  Labs on Admission:  Basic Metabolic Panel: Recent Labs  Lab 07/02/18 0523 07/03/18 0504  NA 137 137  K 3.7 3.6  CL 103 105  CO2 25 25  GLUCOSE 164* 132*  BUN 13 18  CREATININE 0.91 0.88   CALCIUM 8.5* 8.4*   Liver Function Tests: No results for input(s): AST, ALT, ALKPHOS, BILITOT, PROT, ALBUMIN in the last 168 hours. No results for input(s): LIPASE, AMYLASE in the last 168 hours. No results for input(s): AMMONIA in the last 168 hours. CBC: Recent Labs  Lab 07/02/18 0523 07/03/18 0504  WBC 15.8* 18.5*  HGB 9.5* 8.0*  HCT 32.5* 26.7*  MCV 78.5* 76.7*  PLT 277 285   Cardiac Enzymes: No results for input(s): CKTOTAL, CKMB, CKMBINDEX, TROPONINI in the last 168 hours. BNP: Invalid input(s): POCBNP CBG: Recent Labs  Lab 07/03/18 1356  GLUCAP 101*    Radiological Exams on Admission: No results found.  EKG: Independently reviewed. As noted above.  Appears similar to prior.  RBBB.  Sinus rhythm.  Prolonged QTc.  Time spent: 50 min  Lacretia Nicks [facility] Pager AMION  If 7PM-7AM, please contact night-coverage [email or web address] Password [facility] 07/03/2018, 5:53 PM

## 2018-07-03 NOTE — Progress Notes (Addendum)
   Subjective: 2 Days Post-Op Procedure(s) (LRB): TOTAL HIP ARTHROPLASTY ANTERIOR APPROACH (Left) Patient reports pain as mild.   Patient seen in rounds by Dr. Lequita Halt. Patient is well, and has had no acute complaints or problems. States he is ready to go home. Denies chest pain or SOB. Voiding without difficulty and positive flatus. No issues overnight. Plan is to go Home after hospital stay.  Objective: Vital signs in last 24 hours: Temp:  [96.8 F (36 C)-98.3 F (36.8 C)] 97.9 F (36.6 C) (02/14 0556) Pulse Rate:  [71-91] 78 (02/14 0556) Resp:  [16-17] 16 (02/14 0556) BP: (126-164)/(79-90) 152/79 (02/14 0556) SpO2:  [96 %-99 %] 99 % (02/14 0556)  Intake/Output from previous day:  Intake/Output Summary (Last 24 hours) at 07/03/2018 0739 Last data filed at 07/03/2018 0553 Gross per 24 hour  Intake 1080 ml  Output 2275 ml  Net -1195 ml    Labs: Recent Labs    07/02/18 0523 07/03/18 0504  HGB 9.5* 8.0*   Recent Labs    07/02/18 0523 07/03/18 0504  WBC 15.8* 18.5*  RBC 4.14* 3.48*  HCT 32.5* 26.7*  PLT 277 285   Recent Labs    07/02/18 0523 07/03/18 0504  NA 137 137  K 3.7 3.6  CL 103 105  CO2 25 25  BUN 13 18  CREATININE 0.91 0.88  GLUCOSE 164* 132*  CALCIUM 8.5* 8.4*   Exam: General - Patient is Alert and Oriented Extremity - Neurologically intact Neurovascular intact Sensation intact distally Dorsiflexion/Plantar flexion intact Dressing/Incision - clean, dry, no drainage Motor Function - intact, moving foot and toes well on exam.   Past Medical History:  Diagnosis Date  . Anxiety   . Arthritis   . Hypertension   . Hypothyroidism   . Vertigo     Assessment/Plan: 2 Days Post-Op Procedure(s) (LRB): TOTAL HIP ARTHROPLASTY ANTERIOR APPROACH (Left) Principal Problem:   OA (osteoarthritis) of hip  Estimated body mass index is 33.33 kg/m as calculated from the following:   Height as of this encounter: 5\' 11"  (1.803 m).   Weight as of this  encounter: 108.4 kg. Up with therapy D/C IV fluids  DVT Prophylaxis - Aspirin Weight-bearing as tolerated  Hemoglobin at 8.0 this AM, was only 11.1 pre-operatively. Pt asymptomatic. Will send home with a few weeks of 325 mg ferrous sulfate BID.  Plan for discharge to home later today with HEP as long as meeting goals with therapy. Follow-up in the office in 2 weeks.  Arther Abbott, PA-C Orthopedic Surgery 07/03/2018, 7:39 AM

## 2018-07-03 NOTE — Progress Notes (Addendum)
At 1355, the pt passed out in the wheelchair as he was being wheeled out to be d/c home. The pt came to after a few seconds. Pt became pale and sweaty. The pt's blood sugar was 101. BP 121/73, pulse 67, 100% on room air, respirations 17. At 1358, Kern Alberta, PA was notified. The pt was placed back in bed. The pt reports that this happens at home sometimes. The pt reports feeling better at this time. I will continue to monitor the pt.  [facility] was paged as well at 1415.   At 1420, Danford Bad returned my called and recommended the pt stay another night with a medical consult to follow up.

## 2018-07-03 NOTE — Progress Notes (Signed)
The pt was provided with d/c instructions. After discussing the pt's plan of care upon d/c home, the pt reported no further questions or concerns.  

## 2018-07-03 NOTE — Care Management Note (Signed)
Case Management Note  Patient Details  Name: Douglas Brewer. MRN: [id number] Date of Birth: May 01, 1946  Subjective/Objective:                  discharged  Action/Plan: will do hep /has equip.  Expected Discharge Date:  07/03/18               Expected Discharge Plan:  Home/Self Care  In-House Referral:  NA  Discharge planning Services  CM Consult  Post Acute Care Choice:  Durable Medical Equipment Choice offered to:  Patient  DME Arranged:  3-N-1 DME Agency:  [facility]  HH Arranged:  NA HH Agency:  NA  Status of Service:  Completed, signed off  If discussed at Long Length of Stay Meetings, dates discussed:    Additional Comments:  Golda Acre, RN 07/03/2018, 10:58 AM

## 2018-07-03 NOTE — Progress Notes (Signed)
Physical Therapy Treatment Patient Details Name: Douglas Brewer. MRN: [id number] DOB: Sep 13, 1945 Today's Date: 07/03/2018    History of Present Illness 73 yo male s/p L DA-THA on 07/01/18. PMH includes vasovagal syncope, anxiety, HTN, vertigo.     PT Comments    Pt is progressing well with mobility and is ready to DC home from PT standpoint. Pt ambulated 100' with RW and performed THA HEP with good technique. Stair training completed yesterday. Pt had no dizziness/syncope this session.    Follow Up Recommendations  Follow surgeon's recommendation for DC plan and follow-up therapies;Supervision for mobility/OOB     Equipment Recommendations  3in1 (PT)    Recommendations for Other Services       Precautions / Restrictions Precautions Precautions: Fall Precaution Comments: h/o vertigo/syncope, takes meclazine BID prophylactically at baseline Restrictions Weight Bearing Restrictions: No Other Position/Activity Restrictions: WBAT     Mobility  Bed Mobility Overal bed mobility: Needs Assistance Bed Mobility: Supine to Sit     Supine to sit: Supervision     General bed mobility comments: pt used gait belt looped around L foot as a leg lifter  Transfers Overall transfer level: Needs assistance Equipment used: Rolling walker (2 wheeled) Transfers: Sit to/from Stand Sit to Stand: Min guard         General transfer comment: min/guard for safety, no dizziness with position changes  Ambulation/Gait Ambulation/Gait assistance: Min guard Gait Distance (Feet): 100 Feet Assistive device: Rolling walker (2 wheeled) Gait Pattern/deviations: Step-to pattern;Antalgic Gait velocity: decr    General Gait Details: VCs sequencing, no loss of balance, pt denied dizziness   Stairs             Wheelchair Mobility    Modified Rankin (Stroke Patients Only)       Balance Overall balance assessment: Modified Independent         Standing balance support: During  functional activity;Bilateral upper extremity supported Standing balance-Leahy Scale: Poor Standing balance comment: reliant on UEs                            Cognition Arousal/Alertness: Awake/alert Behavior During Therapy: WFL for tasks assessed/performed Overall Cognitive Status: Within Functional Limits for tasks assessed                                        Exercises Total Joint Exercises Ankle Circles/Pumps: AROM;Both;10 reps;Supine Quad Sets: AROM;Both;10 reps;Supine Short Arc Quad: AROM;Left;10 reps;Supine Heel Slides: AAROM;Left;15 reps;Supine Hip ABduction/ADduction: AAROM;Left;15 reps;Supine Long Arc Quad: AROM;Left;10 reps;Seated    General Comments        Pertinent Vitals/Pain Pain Score: 4  Pain Location: L hip  Pain Descriptors / Indicators: Sore Pain Intervention(s): Limited activity within patient's tolerance;Monitored during session;Premedicated before session;Ice applied    Home Living                      Prior Function            PT Goals (current goals can now be found in the care plan section) Acute Rehab PT Goals Patient Stated Goal: go hunting PT Goal Formulation: With patient Time For Goal Achievement: 07/08/18 Potential to Achieve Goals: Good Progress towards PT goals: Progressing toward goals    Frequency    7X/week      PT Plan Current plan remains appropriate  Co-evaluation              AM-PAC PT "6 Clicks" Mobility   Outcome Measure  Help needed turning from your back to your side while in a flat bed without using bedrails?: None Help needed moving from lying on your back to sitting on the side of a flat bed without using bedrails?: None Help needed moving to and from a bed to a chair (including a wheelchair)?: None Help needed standing up from a chair using your arms (e.g., wheelchair or bedside chair)?: None Help needed to walk in hospital room?: None Help needed climbing  3-5 steps with a railing? : A Little 6 Click Score: 23    End of Session Equipment Utilized During Treatment: Gait belt Activity Tolerance: Patient limited by fatigue Patient left: with call bell/phone within reach;in chair;with chair alarm set Nurse Communication: Mobility status PT Visit Diagnosis: Other abnormalities of gait and mobility (R26.89);Difficulty in walking, not elsewhere classified (R26.2)     Time: 0933-1000 PT Time Calculation (min) (ACUTE ONLY): 27 min  Charges:  $Gait Training: 8-22 mins $Therapeutic Exercise: 8-22 mins                    Ralene Bathe Kistler PT 07/03/2018  Acute Rehabilitation Services Pager [phone] Office [phone]

## 2018-07-03 NOTE — Plan of Care (Signed)
Plan of care discussed.   

## 2018-07-03 NOTE — Care Management Important Message (Signed)
Important Message  Patient Details  Name: Douglas Brewer. MRN: [id number] Date of Birth: [birth year]   Medicare Important Message Given:  Yes    Caren Macadam 07/03/2018, 1:01 PMImportant Message  Patient Details  Name: Douglas Brewer. MRN: [id number] Date of Birth: Jun 26, 1945   Medicare Important Message Given:  Yes    Caren Macadam 07/03/2018, 1:01 PM

## 2018-07-03 NOTE — Progress Notes (Signed)
Report was provided at 1640. The pt is being transferred to 1437 for further monitoring.

## 2018-07-04 DIAGNOSIS — Z419 Encounter for procedure for purposes other than remedying health state, unspecified: Secondary | ICD-10-CM

## 2018-07-04 LAB — CBC
HCT: 24.3 % — ABNORMAL LOW (ref 39.0–52.0)
Hemoglobin: 7.2 g/dL — ABNORMAL LOW (ref 13.0–17.0)
MCH: 23.3 pg — ABNORMAL LOW (ref 26.0–34.0)
MCHC: 29.6 g/dL — ABNORMAL LOW (ref 30.0–36.0)
MCV: 78.6 fL — ABNORMAL LOW (ref 80.0–100.0)
NRBC: 0 % (ref 0.0–0.2)
Platelets: 225 10*3/uL (ref 150–400)
RBC: 3.09 MIL/uL — ABNORMAL LOW (ref 4.22–5.81)
RDW: 16.8 % — ABNORMAL HIGH (ref 11.5–15.5)
WBC: 12.2 10*3/uL — ABNORMAL HIGH (ref 4.0–10.5)

## 2018-07-04 LAB — BASIC METABOLIC PANEL
Anion gap: 6 (ref 5–15)
BUN: 16 mg/dL (ref 8–23)
CO2: 28 mmol/L (ref 22–32)
Calcium: 8 mg/dL — ABNORMAL LOW (ref 8.9–10.3)
Chloride: 106 mmol/L (ref 98–111)
Creatinine, Ser: 0.92 mg/dL (ref 0.61–1.24)
GFR calc Af Amer: >60 mL/min (ref >60)
GFR calc non Af Amer: >60 mL/min (ref >60)
Glucose, Bld: 106 mg/dL — ABNORMAL HIGH (ref 70–99)
Potassium: 3.4 mmol/L — ABNORMAL LOW (ref 3.5–5.1)
Sodium: 140 mmol/L (ref 135–145)

## 2018-07-04 LAB — MAGNESIUM: Magnesium: 2 mg/dL (ref 1.7–2.4)

## 2018-07-04 MED ORDER — POTASSIUM CHLORIDE CRYS ER 20 MEQ PO TBCR
40.0000 meq | EXTENDED_RELEASE_TABLET | Freq: Two times a day (BID) | ORAL | Status: AC
  Administered 2018-07-04 (×2): 40 meq via ORAL
  Filled 2018-07-04 (×2): qty 2

## 2018-07-04 NOTE — Progress Notes (Addendum)
Subjective: 3 Days Post-Op Procedure(s) (LRB): TOTAL HIP ARTHROPLASTY ANTERIOR APPROACH (Left) Patient reports pain as 3 on 0-10 scale.  Patient complains of another "dizzy spell" this morning after looking at light. Tolerating PO, no nausea. +voiding, +flatus, +BM. Denies SOB, CP, calf pain, sweats/chills.   Objective: Vital signs in last 24 hours: Temp:  [98.1 F (36.7 C)-98.7 F (37.1 C)] 98.3 F (36.8 C) (02/15 0513) Pulse Rate:  [64-79] 64 (02/15 0513) Resp:  [12-18] 14 (02/15 0513) BP: (110-168)/(64-83) 145/83 (02/15 0513) SpO2:  [95 %-100 %] 95 % (02/15 0513)  Intake/Output from previous day: 02/14 0701 - 02/15 0700 In: 2969.2 [P.O.:1460; I.V.:1263.8; IV Piggyback:245.3] Out: 1675 [Urine:1675] Intake/Output this shift: Total I/O In: -  Out: 850 [Urine:850]  Recent Labs    07/02/18 0523 07/03/18 0504 07/04/18 0400  HGB 9.5* 8.0* 7.2*   Recent Labs    07/03/18 0504 07/04/18 0400  WBC 18.5* 12.2*  RBC 3.48* 3.09*  HCT 26.7* 24.3*  PLT 285 225   Recent Labs    07/03/18 0504 07/04/18 0400  NA 137 140  K 3.6 3.4*  CL 105 106  CO2 25 28  BUN 18 16  CREATININE 0.88 0.92  GLUCOSE 132* 106*  CALCIUM 8.4* 8.0*   No results for input(s): LABPT, INR in the last 72 hours.  Neurologically intact ABD soft Neurovascular intact Sensation intact distally Intact pulses distally Dorsiflexion/Plantar flexion intact Incision: dressing C/D/I No cellulitis present Compartment soft   Assessment/Plan: 3 Days Post-Op Procedure(s) (LRB): TOTAL HIP ARTHROPLASTY ANTERIOR APPROACH (Left) OOB with PT/OT, WBAT DVT ppx: ASA, TEDs, SCDs, ambulation  Per medicine note yesterday, plan to D/C home with outpatient cards/neuro, if no new events; however, he does complain of a dizzy episode this morning. EEG vs D/C today. From an orthopedic perspective he is good to D/C home. Will wait to write D/C order until seen by medicine.   Addendum: Spoke with hospitalist and my  Attending (Dr. Shon Baton) will not D/C home today. D/C home tomorrow on PO iron pending no further syncopal episodes. No transfusion necessary. Encourage PO fluids.  Leonette Monarch Ward 07/04/2018, 9:43 AM

## 2018-07-04 NOTE — Progress Notes (Signed)
PROGRESS NOTE    Douglas Brewer.  RDE:[id number] DOB: Jun 25, 1945 DOA: 07/01/2018 PCP: Shelly Rubenstein, MD    Brief Narrative:  73 year old male with a history of hypertension, hypothyroidism, anxiety, left hip arthritis, and reflex syncope who presented for a left total hip arthroplasty.  He underwent surgery on February 12.  Plan was for discharge today, but when patient was being transported in a wheelchair he had a syncopal event.  The patient notes 8 years of recurrent episodes of syncope.  He notes that he had not had any issues in about 5 months, but last week had 4-5 dizzy spells.  He describes that at times he has pressure in his left ear preceding this.  Yesterday when he was working with therapy, he felt like someone was putting him to sleep.  He felt vertigo.  This lasted 15 to 20 seconds.  He denies any chest pain, shortness of breath.  He notes lightheadedness.  The symptoms lasted around 20 seconds and stopped.  Today he was ready to be discharged, but when he was in the wheelchair he became pale and fainted for a few seconds.  Medicine was consulted for recurrent syncope.  Family notes that sometimes after these episodes he may be a little bit confused.  They deny seeing any clear seizure-like activity, urinary incontinence, or ever biting his tongue.  He has had extensive evaluations in the past by cardiology, neurology, and ENT.  He feels like he is never been given a clear answer as to what is going on.  He denies smoking or drinking.  Assessment & Plan:   Principal Problem:   OA (osteoarthritis) of hip   1. Syncope: pt long history (he notes 8 years) of recurrent episodes of what appear to be syncope.  He's been seen by cardiology and neurology in the past and undergone extensive testing and workup.  Per chart review, looks like concern was for reflex syncope (or neurally mediated syncope), predominantly vasodepressor type.  Cardiology (2016 notes) did not recommend loop  recorder in the past as no bradycardia seen in prior monitoring.  He saw neurology in 10/2014 (care everywhere) who noted that the etiology of his events was unclear.   1. Suspect this is reflex syncope as previously suspected by cardiology, followed by outpatient Neurology 2. Mucus membranes appear dry although repeat orthostatic vitals are neg 3. This AM, patient reported vertiginous type symptoms for which pt has been on meclizine for several years 4. Neuro exam performed and is benign 5. Heart monitor reviewed, PVC's noted otherwise unremarkable 6. Hgb noted to be 7.2 from 8.0 yesterday and 9.5 the day before. Defer to Orthopedic surgery if transfusion is warranted at this time. Repeat orthostatic vitals are neg 7. If transfusion not warranted, then would recommend continued outpatient follow up with Neurology/PCP. Will discuss with Orthopedic Service 2. EKG shows RBBB that appears similar to priors.  QTc slightly prolonged at 469.    3. Encourage slow transition from sitting -> standing.   4. Recommend close outpatient follow up with Neurology  5. Discussed that he should not drive until cleared by physician.  Acute Blood Loss Anemia: post op. Hgb today 7.2, pt not orthostatic. Defer to Orthopedic Surgery regarding if/when transfusion is warranted  Leukocytosis: likely reactive, improved this AM  Antimicrobials: Anti-infectives (From admission, onward)   Start     Dose/Rate Route Frequency Ordered Stop   07/01/18 1800  ceFAZolin (ANCEF) IVPB 2g/100 mL premix  2 g 200 mL/hr over 30 Minutes Intravenous Every 6 hours 07/01/18 1649 07/02/18 0028   07/01/18 1145  ceFAZolin (ANCEF) IVPB 2g/100 mL premix     2 g 200 mL/hr over 30 Minutes Intravenous On call to O.R. 07/01/18 1137 07/01/18 1300       Subjective: Eager to go home  Objective: Vitals:   07/03/18 1707 07/03/18 2047 07/04/18 0000 07/04/18 0513  BP: (!) 168/77 (!) 151/79  (!) 145/83  Pulse: 79 76  64  Resp: 18 12 18  14   Temp: 98.1 F (36.7 C) 98.7 F (37.1 C)  98.3 F (36.8 C)  TempSrc: Oral Oral  Oral  SpO2: 98% 97%  95%  Weight:      Height:        Intake/Output Summary (Last 24 hours) at 07/04/2018 1154 Last data filed at 07/04/2018 0925 Gross per 24 hour  Intake 2243.83 ml  Output 2375 ml  Net -131.17 ml   Filed Weights   07/01/18 1138  Weight: 108.4 kg    Examination:  General exam: Appears calm and comfortable  Respiratory system: Clear to auscultation. Respiratory effort normal. Cardiovascular system: S1 & S2 heard, RRR. S1, s2 Gastrointestinal system: Abdomen is nondistended, soft and nontender. No organomegaly or masses felt. Normal bowel sounds heard. Central nervous system: Alert and oriented. No focal neurological deficits. Extremities: Symmetric 5 x 5 power. Skin: No rashes, lesions  Psychiatry: Judgement and insight appear normal. Mood & affect appropriate.   Data Reviewed: I have personally reviewed following labs and imaging studies  CBC: Recent Labs  Lab 07/02/18 0523 07/03/18 0504 07/04/18 0400  WBC 15.8* 18.5* 12.2*  HGB 9.5* 8.0* 7.2*  HCT 32.5* 26.7* 24.3*  MCV 78.5* 76.7* 78.6*  PLT 277 285 225   Basic Metabolic Panel: Recent Labs  Lab 07/02/18 0523 07/03/18 0504 07/04/18 0400  NA 137 137 140  K 3.7 3.6 3.4*  CL 103 105 106  CO2 25 25 28   GLUCOSE 164* 132* 106*  BUN 13 18 16   CREATININE 0.91 0.88 0.92  CALCIUM 8.5* 8.4* 8.0*  MG  --   --  2.0   GFR: Estimated Creatinine Clearance: 93.5 mL/min (by C-G formula based on SCr of 0.92 mg/dL). Liver Function Tests: No results for input(s): AST, ALT, ALKPHOS, BILITOT, PROT, ALBUMIN in the last 168 hours. No results for input(s): LIPASE, AMYLASE in the last 168 hours. No results for input(s): AMMONIA in the last 168 hours. Coagulation Profile: No results for input(s): INR, PROTIME in the last 168 hours. Cardiac Enzymes: No results for input(s): CKTOTAL, CKMB, CKMBINDEX, TROPONINI in the last 168  hours. BNP (last 3 results) No results for input(s): PROBNP in the last 8760 hours. HbA1C: No results for input(s): HGBA1C in the last 72 hours. CBG: Recent Labs  Lab 07/03/18 1356  GLUCAP 101*   Lipid Profile: No results for input(s): CHOL, HDL, LDLCALC, TRIG, CHOLHDL, LDLDIRECT in the last 72 hours. Thyroid Function Tests: No results for input(s): TSH, T4TOTAL, FREET4, T3FREE, THYROIDAB in the last 72 hours. Anemia Panel: No results for input(s): VITAMINB12, FOLATE, FERRITIN, TIBC, IRON, RETICCTPCT in the last 72 hours. Sepsis Labs: No results for input(s): PROCALCITON, LATICACIDVEN in the last 168 hours.  No results found for this or any previous visit (from the past 240 hour(s)).   Radiology Studies: No results found.  Scheduled Meds: . amitriptyline  25 mg Oral QHS  . amLODipine  10 mg Oral Daily  . aspirin EC  325 mg  Oral BID  . clonazePAM  1 mg Oral QHS  . diazepam  5 mg Oral q morning - 10a  . docusate sodium  100 mg Oral BID  . levothyroxine  75 mcg Oral Q0600  . losartan  100 mg Oral Daily  . meclizine  25 mg Oral BID  . potassium chloride  40 mEq Oral BID   Continuous Infusions: . sodium chloride Stopped (07/02/18 1511)  . methocarbamol (ROBAXIN) IV       LOS: 3 days   Rickey Barbara, MD [facility] Pager On Amion  If 7PM-7AM, please contact night-coverage 07/04/2018, 11:54 AM

## 2018-07-05 DIAGNOSIS — R55 Syncope and collapse: Secondary | ICD-10-CM

## 2018-07-05 LAB — CBC
HCT: 26.2 % — ABNORMAL LOW (ref 39.0–52.0)
Hemoglobin: 7.6 g/dL — ABNORMAL LOW (ref 13.0–17.0)
MCH: 23.1 pg — ABNORMAL LOW (ref 26.0–34.0)
MCHC: 29 g/dL — ABNORMAL LOW (ref 30.0–36.0)
MCV: 79.6 fL — ABNORMAL LOW (ref 80.0–100.0)
Platelets: 264 10*3/uL (ref 150–400)
RBC: 3.29 MIL/uL — ABNORMAL LOW (ref 4.22–5.81)
RDW: 17 % — ABNORMAL HIGH (ref 11.5–15.5)
WBC: 10.8 10*3/uL — AB (ref 4.0–10.5)
nRBC: 0 % (ref 0.0–0.2)

## 2018-07-05 LAB — BASIC METABOLIC PANEL
Anion gap: 7 (ref 5–15)
BUN: 16 mg/dL (ref 8–23)
CALCIUM: 8.4 mg/dL — AB (ref 8.9–10.3)
CO2: 29 mmol/L (ref 22–32)
CREATININE: 0.97 mg/dL (ref 0.61–1.24)
Chloride: 104 mmol/L (ref 98–111)
GFR calc Af Amer: >60 mL/min (ref >60)
GFR calc non Af Amer: >60 mL/min (ref >60)
Glucose, Bld: 96 mg/dL (ref 70–99)
Potassium: 3.9 mmol/L (ref 3.5–5.1)
Sodium: 140 mmol/L (ref 135–145)

## 2018-07-05 MED ORDER — LACTATED RINGERS IV SOLN
INTRAVENOUS | Status: DC
  Administered 2018-07-05 (×2): via INTRAVENOUS

## 2018-07-05 NOTE — Progress Notes (Signed)
Patient ID: Douglas Brewer., male   DOB: Jun 09, 1945, 73 y.o.   MRN: [id number] Subjective: 4 Days Post-Op Procedure(s) (LRB): TOTAL HIP ARTHROPLASTY ANTERIOR APPROACH (Left)    Patient reports pain as mild.  Not much reported activity since being transferred to 4 W  Objective:   VITALS:   Vitals:   07/04/18 2150 07/05/18 0508  BP: (!) 163/80 (!) 152/82  Pulse: 82 75  Resp: 16 16  Temp: 99.1 F (37.3 C) 98.5 F (36.9 C)  SpO2: 95% 96%    Neurovascular intact Incision: dressing C/D/I  LABS Recent Labs    07/03/18 0504 07/04/18 0400 07/05/18 0449  HGB 8.0* 7.2* 7.6*  HCT 26.7* 24.3* 26.2*  WBC 18.5* 12.2* 10.8*  PLT 285 225 264   Hgb stabilized at this point with slight increase  Recent Labs    07/03/18 0504 07/04/18 0400 07/05/18 0449  NA 137 140 140  K 3.6 3.4* 3.9  BUN 18 16 16   CREATININE 0.88 0.92 0.97  GLUCOSE 132* 106* 96    No results for input(s): LABPT, INR in the last 72 hours.   Assessment/Plan: 4 Days Post-Op Procedure(s) (LRB): TOTAL HIP ARTHROPLASTY ANTERIOR APPROACH (Left)   Up with therapy today If does well with therapy then ok to go home today RTC in 2 weeks per Aluisio

## 2018-07-05 NOTE — Progress Notes (Signed)
Physical Therapy Treatment Patient Details Name: Douglas Brewer. MRN: [id number] DOB: [birth year] Today's Date: 07/05/2018    History of Present Illness 73 yo male s/p L DA-THA on 07/01/18. PMH includes vasovagal syncope, anxiety, HTN, vertigo.     PT Comments    Pt ambulated 77' with no dizziness.  Recommend HHPT.   Follow Up Recommendations  Follow surgeon's recommendation for DC plan and follow-up therapies;Supervision for mobility/OOB     Equipment Recommendations  3in1 (PT)    Recommendations for Other Services       Precautions / Restrictions Precautions Precautions: Fall Precaution Comments: h/o vertigo/syncope, takes meclazine BID prophylactically at baseline. Multiple syncopal episodes during hospitalization Restrictions Weight Bearing Restrictions: No    Mobility  Bed Mobility Overal bed mobility: Needs Assistance Bed Mobility: Supine to Sit     Supine to sit: Supervision     General bed mobility comments: Heavy use of rail. Pt able to use R LE to move L LE  Transfers Overall transfer level: Needs assistance Equipment used: Rolling walker (2 wheeled)   Sit to Stand: Min guard         General transfer comment: min/guard with cues for hand placement.  No dizziness noted.  Ambulation/Gait Ambulation/Gait assistance: Min guard Gait Distance (Feet): 50 Feet Assistive device: Rolling walker (2 wheeled) Gait Pattern/deviations: Step-to pattern;Antalgic Gait velocity: decr    General Gait Details: Pt with decreased L knee flexion during swing through phase. no dizziness during gait, but once sitting in recliner he said he felt a little off.  HR 77. With pursed lip breathing he felt better.   Stairs             Wheelchair Mobility    Modified Rankin (Stroke Patients Only)       Balance           Standing balance support: During functional activity;Bilateral upper extremity supported Standing balance-Leahy Scale: Poor Standing  balance comment: reliant on UEs                            Cognition Arousal/Alertness: Awake/alert Behavior During Therapy: WFL for tasks assessed/performed Overall Cognitive Status: Within Functional Limits for tasks assessed                                        Exercises      General Comments General comments (skin integrity, edema, etc.): Pt reports he has been doing his LE therex in bed.      Pertinent Vitals/Pain Pain Assessment: 0-10 Pain Score: 3  Pain Location: L hip  Pain Descriptors / Indicators: Sore Pain Intervention(s): Limited activity within patient's tolerance;Monitored during session;Repositioned;Premedicated before session    Home Living                      Prior Function            PT Goals (current goals can now be found in the care plan section) Acute Rehab PT Goals PT Goal Formulation: With patient Time For Goal Achievement: 07/08/18 Potential to Achieve Goals: Good Progress towards PT goals: Progressing toward goals    Frequency    7X/week      PT Plan Current plan remains appropriate    Co-evaluation              AM-PAC PT "6  Clicks" Mobility   Outcome Measure  Help needed turning from your back to your side while in a flat bed without using bedrails?: None Help needed moving from lying on your back to sitting on the side of a flat bed without using bedrails?: None Help needed moving to and from a bed to a chair (including a wheelchair)?: None Help needed standing up from a chair using your arms (e.g., wheelchair or bedside chair)?: A Little Help needed to walk in hospital room?: A Little Help needed climbing 3-5 steps with a railing? : A Lot 6 Click Score: 20    End of Session Equipment Utilized During Treatment: Gait belt Activity Tolerance: Patient tolerated treatment well Patient left: in chair;with call bell/phone within reach Nurse Communication: Mobility status PT Visit  Diagnosis: Other abnormalities of gait and mobility (R26.89);Difficulty in walking, not elsewhere classified (R26.2)     Time: 7672-0947 PT Time Calculation (min) (ACUTE ONLY): 19 min  Charges:  $Gait Training: 8-22 mins                     Douglas Brewer, [name] Pager [phone] 07/05/2018    Douglas Brewer 07/05/2018, 12:10 PM

## 2018-07-05 NOTE — Progress Notes (Addendum)
Patient assisted up to bathroom with walker.  Pt. wanted to sit on toilet for awhile to have BM. He was instructed to call for assistance when finished. Pt. called and nurse r/t rm. Pt. standing at sink with walker c/o left hip pain "so bad I can't move". Pt. states "I'm gonna go out". He was sweating profusely. Assisted to chair. When pt. sat down he became pale, eyes rolled back and he lost consciousness for approx 15 sec. He gradually r/t baseline. VSS, O2 sat 100% on RA. Pt. states, "I think my hip just got to hurting so bad while I sat on that toilet that it made me weak." Pt. transferred back to bed. Pain med given. MD paged.   VSS: 122/50           89 pulse           16 resp.           100% O2 sat  Patient denies dizziness or his typical vertigo symptoms with this episode.

## 2018-07-05 NOTE — Progress Notes (Signed)
PROGRESS NOTE    Douglas Brewer.  XHB:[id number] DOB: 05-03-1946 DOA: 07/01/2018 PCP: Shelly Rubenstein, MD    Brief Narrative:  73 year old male with a history of hypertension, hypothyroidism, anxiety, left hip arthritis, and reflex syncope who presented for a left total hip arthroplasty.  He underwent surgery on February 12.  Plan was for discharge today, but when patient was being transported in a wheelchair he had a syncopal event.  The patient notes 8 years of recurrent episodes of syncope.  He notes that he had not had any issues in about 5 months, but last week had 4-5 dizzy spells.  He describes that at times he has pressure in his left ear preceding this.  Yesterday when he was working with therapy, he felt like someone was putting him to sleep.  He felt vertigo.  This lasted 15 to 20 seconds.  He denies any chest pain, shortness of breath.  He notes lightheadedness.  The symptoms lasted around 20 seconds and stopped.  Today he was ready to be discharged, but when he was in the wheelchair he became pale and fainted for a few seconds.  Medicine was consulted for recurrent syncope.  Family notes that sometimes after these episodes he may be a little bit confused.  They deny seeing any clear seizure-like activity, urinary incontinence, or ever biting his tongue.  He has had extensive evaluations in the past by cardiology, neurology, and ENT.  He feels like he is never been given a clear answer as to what is going on.  He denies smoking or drinking.  Assessment & Plan:   Principal Problem:   OA (osteoarthritis) of hip   1. Syncope: pt long history (he notes 8 years) of recurrent episodes of what appear to be syncope.  He's been seen by cardiology and neurology in the past and undergone extensive testing and workup.  Per chart review, looks like concern was for reflex syncope (or neurally mediated syncope), predominantly vasodepressor type.  Cardiology (2016 notes) did not recommend loop  recorder in the past as no bradycardia seen in prior monitoring.  He saw neurology in 10/2014 (care everywhere) who noted that the etiology of his events was unclear.   1. Suspect this is reflex syncope as previously suspected by cardiology, followed by outpatient Neurology 2. Mucus membranes appear dry although repeat orthostatic vitals are neg 3. This AM, patient reported vertiginous type symptoms for which pt has been on meclizine for several years 4. Neuro exam performed and remains benign 5. Heart monitor reviewed, PVC's noted otherwise unremarkable 6. Hgb stable this AM 7. Recurrent syncope this AM after moving bowels. Symptoms began after sharp excruciating pain in post-op hip resulting in diaphoresis, dizziness/lightheadedness then syncope for several seconds. Repeat orthostatics unremarkable. 8. Suspect vasovagal triggered by pain. Will start 75cc/hr IVF given dry mucus membranes  2. EKG shows RBBB that appears similar to priors.  QTc slightly prolonged at 469. Stable   3. Recommend close outpatient follow up with Neurology  4. Discussed that he should not drive until cleared by physician.  Acute Blood Loss Anemia: post op. Hgb today 7.2, pt not orthostatic. Defer to Orthopedic Surgery regarding if/when transfusion is warranted  Leukocytosis: likely reactive, improved  Antimicrobials: Anti-infectives (From admission, onward)   Start     Dose/Rate Route Frequency Ordered Stop   07/01/18 1800  ceFAZolin (ANCEF) IVPB 2g/100 mL premix     2 g 200 mL/hr over 30 Minutes Intravenous Every 6 hours 07/01/18  1649 07/02/18 0028   07/01/18 1145  ceFAZolin (ANCEF) IVPB 2g/100 mL premix     2 g 200 mL/hr over 30 Minutes Intravenous On call to O.R. 07/01/18 1137 07/01/18 1300      Subjective: Syncope noted this AM  Objective: Vitals:   07/04/18 2150 07/05/18 0508 07/05/18 0835 07/05/18 0911  BP: (!) 163/80 (!) 152/82 (!) 122/50 124/76  Pulse: 82 75 89 67  Resp: 16 16  16   Temp: 99.1  F (37.3 C) 98.5 F (36.9 C)    TempSrc: Oral Oral    SpO2: 95% 96%  98%  Weight:      Height:        Intake/Output Summary (Last 24 hours) at 07/05/2018 1032 Last data filed at 07/05/2018 0846 Gross per 24 hour  Intake 1200 ml  Output 2860 ml  Net -1660 ml   Filed Weights   07/01/18 1138  Weight: 108.4 kg    Examination: General exam: Conversant, in no acute distress Respiratory system: normal chest rise, clear, no audible wheezing Cardiovascular system: regular rhythm, s1-s2 Gastrointestinal system: Nondistended, nontender, pos BS Central nervous system: No seizures, no tremors Extremities: No cyanosis, no joint deformities Skin: No rashes, no pallor Psychiatry: Affect normal // no auditory hallucinations   Data Reviewed: I have personally reviewed following labs and imaging studies  CBC: Recent Labs  Lab 07/02/18 0523 07/03/18 0504 07/04/18 0400 07/05/18 0449  WBC 15.8* 18.5* 12.2* 10.8*  HGB 9.5* 8.0* 7.2* 7.6*  HCT 32.5* 26.7* 24.3* 26.2*  MCV 78.5* 76.7* 78.6* 79.6*  PLT 277 285 225 264   Basic Metabolic Panel: Recent Labs  Lab 07/02/18 0523 07/03/18 0504 07/04/18 0400 07/05/18 0449  NA 137 137 140 140  K 3.7 3.6 3.4* 3.9  CL 103 105 106 104  CO2 25 25 28 29   GLUCOSE 164* 132* 106* 96  BUN 13 18 16 16   CREATININE 0.91 0.88 0.92 0.97  CALCIUM 8.5* 8.4* 8.0* 8.4*  MG  --   --  2.0  --    GFR: Estimated Creatinine Clearance: 88.7 mL/min (by C-G formula based on SCr of 0.97 mg/dL). Liver Function Tests: No results for input(s): AST, ALT, ALKPHOS, BILITOT, PROT, ALBUMIN in the last 168 hours. No results for input(s): LIPASE, AMYLASE in the last 168 hours. No results for input(s): AMMONIA in the last 168 hours. Coagulation Profile: No results for input(s): INR, PROTIME in the last 168 hours. Cardiac Enzymes: No results for input(s): CKTOTAL, CKMB, CKMBINDEX, TROPONINI in the last 168 hours. BNP (last 3 results) No results for input(s): PROBNP in  the last 8760 hours. HbA1C: No results for input(s): HGBA1C in the last 72 hours. CBG: Recent Labs  Lab 07/03/18 1356  GLUCAP 101*   Lipid Profile: No results for input(s): CHOL, HDL, LDLCALC, TRIG, CHOLHDL, LDLDIRECT in the last 72 hours. Thyroid Function Tests: No results for input(s): TSH, T4TOTAL, FREET4, T3FREE, THYROIDAB in the last 72 hours. Anemia Panel: No results for input(s): VITAMINB12, FOLATE, FERRITIN, TIBC, IRON, RETICCTPCT in the last 72 hours. Sepsis Labs: No results for input(s): PROCALCITON, LATICACIDVEN in the last 168 hours.  No results found for this or any previous visit (from the past 240 hour(s)).   Radiology Studies: No results found.  Scheduled Meds: . amitriptyline  25 mg Oral QHS  . amLODipine  10 mg Oral Daily  . aspirin EC  325 mg Oral BID  . clonazePAM  1 mg Oral QHS  . diazepam  5 mg Oral  q morning - 10a  . docusate sodium  100 mg Oral BID  . levothyroxine  75 mcg Oral Q0600  . losartan  100 mg Oral Daily  . meclizine  25 mg Oral BID   Continuous Infusions: . sodium chloride Stopped (07/02/18 1511)  . lactated ringers    . methocarbamol (ROBAXIN) IV       LOS: 4 days   Rickey BarbaraStephen Chiu, MD [facility] Pager On Amion  If 7PM-7AM, please contact night-coverage 07/05/2018, 10:32 AM

## 2018-07-06 LAB — BASIC METABOLIC PANEL
Anion gap: 7 (ref 5–15)
BUN: 12 mg/dL (ref 8–23)
CALCIUM: 8.1 mg/dL — AB (ref 8.9–10.3)
CO2: 27 mmol/L (ref 22–32)
Chloride: 102 mmol/L (ref 98–111)
Creatinine, Ser: 0.85 mg/dL (ref 0.61–1.24)
GFR calc Af Amer: >60 mL/min (ref >60)
GFR calc non Af Amer: >60 mL/min (ref >60)
Glucose, Bld: 105 mg/dL — ABNORMAL HIGH (ref 70–99)
Potassium: 3.4 mmol/L — ABNORMAL LOW (ref 3.5–5.1)
Sodium: 136 mmol/L (ref 135–145)

## 2018-07-06 LAB — CBC
HCT: 25.9 % — ABNORMAL LOW (ref 39.0–52.0)
Hemoglobin: 7.7 g/dL — ABNORMAL LOW (ref 13.0–17.0)
MCH: 23 pg — ABNORMAL LOW (ref 26.0–34.0)
MCHC: 29.7 g/dL — ABNORMAL LOW (ref 30.0–36.0)
MCV: 77.3 fL — ABNORMAL LOW (ref 80.0–100.0)
PLATELETS: 274 10*3/uL (ref 150–400)
RBC: 3.35 MIL/uL — ABNORMAL LOW (ref 4.22–5.81)
RDW: 16.6 % — ABNORMAL HIGH (ref 11.5–15.5)
WBC: 10 10*3/uL (ref 4.0–10.5)
nRBC: 0 % (ref 0.0–0.2)

## 2018-07-06 MED ORDER — DOCUSATE SODIUM 100 MG PO CAPS: 100.0000 mg | ORAL_CAPSULE | Freq: Two times a day (BID) | ORAL | 0 refills | Status: DC

## 2018-07-06 MED ORDER — POTASSIUM CHLORIDE CRYS ER 20 MEQ PO TBCR
40.0000 meq | EXTENDED_RELEASE_TABLET | Freq: Once | ORAL | Status: AC
  Administered 2018-07-06: 40 meq via ORAL
  Filled 2018-07-06: qty 2

## 2018-07-06 NOTE — Care Management Important Message (Signed)
Important Message  Patient Details  Name: Danye Pogany. MRN: [id number] Date of Birth: [birth year]   Medicare Important Message Given:  Yes    Caren Macadam 07/06/2018, 12:33 PMImportant Message  Patient Details  Name: Burhan Smyre. MRN: [id number] Date of Birth: May 08, 1946   Medicare Important Message Given:  Yes    Caren Macadam 07/06/2018, 12:32 PM

## 2018-07-06 NOTE — Discharge Summary (Signed)
Physician Discharge Summary   Patient ID: Douglas Brewer. MRN: [id number] DOB/AGE: 1946-03-02 73 y.o.  Admit date: 07/01/2018 Discharge date: 07/06/2018  Primary Diagnosis: Osteoarthritis, left hip   Admission Diagnoses:  Past Medical History:  Diagnosis Date  . Anxiety   . Arthritis   . Hypertension   . Hypothyroidism   . Vertigo    Discharge Diagnoses:   Principal Problem:   OA (osteoarthritis) of hip  Estimated body mass index is 33.33 kg/m as calculated from the following:   Height as of this encounter: 5\' 11"  (1.803 m).   Weight as of this encounter: 108.4 kg.  Procedure:  Procedure(s) (LRB): TOTAL HIP ARTHROPLASTY ANTERIOR APPROACH (Left)   Consults: Hospitalists  HPI: Douglas Blankenburg. is a 73 y.o. male who has advanced end-stage arthritis of their Left  hip with progressively worsening pain and dysfunction.The patient has failed nonoperative management and presents for total hip arthroplasty.   Laboratory Data: Admission on 07/01/2018, Discharged on 07/06/2018  Component Date Value Ref Range Status  . WBC 07/02/2018 15.8* 4.0 - 10.5 K/uL Final  . RBC 07/02/2018 4.14* 4.22 - 5.81 MIL/uL Final  . Hemoglobin 07/02/2018 9.5* 13.0 - 17.0 g/dL Final  . HCT 40/98/1191 32.5* 39.0 - 52.0 % Final  . MCV 07/02/2018 78.5* 80.0 - 100.0 fL Final  . MCH 07/02/2018 22.9* 26.0 - 34.0 pg Final  . MCHC 07/02/2018 29.2* 30.0 - 36.0 g/dL Final  . RDW 47/82/9562 15.6* 11.5 - 15.5 % Final  . Platelets 07/02/2018 277  150 - 400 K/uL Final  . nRBC 07/02/2018 0.0  0.0 - 0.2 % Final   Performed at [facility], [address] [place], [place] [address]  . Sodium 07/02/2018 137  135 - 145 mmol/L Final  . Potassium 07/02/2018 3.7  3.5 - 5.1 mmol/L Final  . Chloride 07/02/2018 103  98 - 111 mmol/L Final  . CO2 07/02/2018 25  22 - 32 mmol/L Final  . Glucose, Bld 07/02/2018 164* 70 - 99 mg/dL Final  . BUN 57/84/6962 13  8 - 23 mg/dL Final  . Creatinine, Ser  07/02/2018 0.91  0.61 - 1.24 mg/dL Final  . Calcium 95/28/4132 8.5* 8.9 - 10.3 mg/dL Final  . GFR calc non Af Amer 07/02/2018 >60  >60 mL/min Final  . GFR calc Af Amer 07/02/2018 >60  >60 mL/min Final  . Anion gap 07/02/2018 9  5 - 15 Final   Performed at [facility], [address] [place], [place] [address]  . WBC 07/03/2018 18.5* 4.0 - 10.5 K/uL Final  . RBC 07/03/2018 3.48* 4.22 - 5.81 MIL/uL Final  . Hemoglobin 07/03/2018 8.0* 13.0 - 17.0 g/dL Final   Comment: Reticulocyte Hemoglobin testing may be clinically indicated, consider ordering this additional test [id number]   . HCT 07/03/2018 26.7* 39.0 - 52.0 % Final  . MCV 07/03/2018 76.7* 80.0 - 100.0 fL Final  . MCH 07/03/2018 23.0* 26.0 - 34.0 pg Final  . MCHC 07/03/2018 30.0  30.0 - 36.0 g/dL Final  . RDW 40/34/7425 15.9* 11.5 - 15.5 % Final  . Platelets 07/03/2018 285  150 - 400 K/uL Final  . nRBC 07/03/2018 0.0  0.0 - 0.2 % Final   Performed at [facility], [address], [place], [place] [address]  . Sodium 07/03/2018 137  135 - 145 mmol/L Final  . Potassium 07/03/2018 3.6  3.5 - 5.1 mmol/L Final  . Chloride 07/03/2018 105  98 -  111 mmol/L Final  . CO2 07/03/2018 25  22 - 32 mmol/L Final  . Glucose, Bld 07/03/2018 132* 70 - 99 mg/dL Final  . BUN 83/33/8329 18  8 - 23 mg/dL Final  . Creatinine, Ser 07/03/2018 0.88  0.61 - 1.24 mg/dL Final  . Calcium 19/16/6060 8.4* 8.9 - 10.3 mg/dL Final  . GFR calc non Af Amer 07/03/2018 >60  >60 mL/min Final  . GFR calc Af Amer 07/03/2018 >60  >60 mL/min Final  . Anion gap 07/03/2018 7  5 - 15 Final   Performed at [facility], [address], [place], [place] [address]  . Glucose-Capillary 07/03/2018 101* 70 - 99 mg/dL Final  . WBC 77/41/4239 12.2* 4.0 - 10.5 K/uL Final  . RBC 07/04/2018 3.09* 4.22 - 5.81 MIL/uL Final  . Hemoglobin 07/04/2018 7.2* 13.0 - 17.0 g/dL Final  . HCT 53/20/2334 24.3* 39.0 - 52.0 % Final  . MCV  07/04/2018 78.6* 80.0 - 100.0 fL Final  . MCH 07/04/2018 23.3* 26.0 - 34.0 pg Final  . MCHC 07/04/2018 29.6* 30.0 - 36.0 g/dL Final  . RDW 35/68/6168 16.8* 11.5 - 15.5 % Final  . Platelets 07/04/2018 225  150 - 400 K/uL Final  . nRBC 07/04/2018 0.0  0.0 - 0.2 % Final   Performed at [facility], [address], [place], [place] [address]  . Sodium 07/04/2018 140  135 - 145 mmol/L Final  . Potassium 07/04/2018 3.4* 3.5 - 5.1 mmol/L Final  . Chloride 07/04/2018 106  98 - 111 mmol/L Final  . CO2 07/04/2018 28  22 - 32 mmol/L Final  . Glucose, Bld 07/04/2018 106* 70 - 99 mg/dL Final  . BUN 21/03/5519 16  8 - 23 mg/dL Final  . Creatinine, Ser 07/04/2018 0.92  0.61 - 1.24 mg/dL Final  . Calcium 80/22/3361 8.0* 8.9 - 10.3 mg/dL Final  . GFR calc non Af Amer 07/04/2018 >60  >60 mL/min Final  . GFR calc Af Amer 07/04/2018 >60  >60 mL/min Final  . Anion gap 07/04/2018 6  5 - 15 Final   Performed at [facility], [address] [place], [place] [address]  . Magnesium 07/04/2018 2.0  1.7 - 2.4 mg/dL Final   Performed at [facility], [address], [place], [place] [address]  . WBC 07/05/2018 10.8* 4.0 - 10.5 K/uL Final  . RBC 07/05/2018 3.29* 4.22 - 5.81 MIL/uL Final  . Hemoglobin 07/05/2018 7.6* 13.0 - 17.0 g/dL Final  . HCT 51/02/2110 26.2* 39.0 - 52.0 % Final  . MCV 07/05/2018 79.6* 80.0 - 100.0 fL Final  . MCH 07/05/2018 23.1* 26.0 - 34.0 pg Final  . MCHC 07/05/2018 29.0* 30.0 - 36.0 g/dL Final  . RDW 73/56/7014 17.0* 11.5 - 15.5 % Final  . Platelets 07/05/2018 264  150 - 400 K/uL Final  . nRBC 07/05/2018 0.0  0.0 - 0.2 % Final   Performed at [facility], [address] [place], [place] [address]  . Sodium 07/05/2018 140  135 - 145 mmol/L Final  . Potassium 07/05/2018 3.9  3.5 - 5.1 mmol/L Final  . Chloride 07/05/2018 104  98 - 111 mmol/L Final  . CO2 07/05/2018 29  22 - 32 mmol/L Final  . Glucose, Bld  07/05/2018 96  70 - 99 mg/dL Final  . BUN 31/43/8887 16  8 - 23 mg/dL Final  . Creatinine, Ser 07/05/2018 0.97  0.61 - 1.24 mg/dL Final  .  Calcium 07/05/2018 8.4* 8.9 - 10.3 mg/dL Final  . GFR calc non Af Amer 07/05/2018 >60  >60 mL/min Final  . GFR calc Af Amer 07/05/2018 >60  >60 mL/min Final  . Anion gap 07/05/2018 7  5 - 15 Final   Performed at [facility], [address] [place], [place] [address]  . Sodium 07/06/2018 136  135 - 145 mmol/L Final  . Potassium 07/06/2018 3.4* 3.5 - 5.1 mmol/L Final  . Chloride 07/06/2018 102  98 - 111 mmol/L Final  . CO2 07/06/2018 27  22 - 32 mmol/L Final  . Glucose, Bld 07/06/2018 105* 70 - 99 mg/dL Final  . BUN 60/45/409802/17/2020 12  8 - 23 mg/dL Final  . Creatinine, Ser 07/06/2018 0.85  0.61 - 1.24 mg/dL Final  . Calcium 11/91/478202/17/2020 8.1* 8.9 - 10.3 mg/dL Final  . GFR calc non Af Amer 07/06/2018 >60  >60 mL/min Final  . GFR calc Af Amer 07/06/2018 >60  >60 mL/min Final  . Anion gap 07/06/2018 7  5 - 15 Final   Performed at [facility], [address] [place], [place] [address]  . WBC 07/06/2018 10.0  4.0 - 10.5 K/uL Final  . RBC 07/06/2018 3.35* 4.22 - 5.81 MIL/uL Final  . Hemoglobin 07/06/2018 7.7* 13.0 - 17.0 g/dL Final   Comment: Reticulocyte Hemoglobin testing may be clinically indicated, consider ordering this additional test [id number]   . HCT 07/06/2018 25.9* 39.0 - 52.0 % Final  . MCV 07/06/2018 77.3* 80.0 - 100.0 fL Final  . MCH 07/06/2018 23.0* 26.0 - 34.0 pg Final  . MCHC 07/06/2018 29.7* 30.0 - 36.0 g/dL Final  . RDW 69/62/952802/17/2020 16.6* 11.5 - 15.5 % Final  . Platelets 07/06/2018 274  150 - 400 K/uL Final  . nRBC 07/06/2018 0.0  0.0 - 0.2 % Final   Performed at [facility], [address] [place], [place] [address]  Hospital Outpatient Visit on 06/24/2018  Component Date Value Ref Range Status  . MRSA, PCR 06/24/2018 NEGATIVE  NEGATIVE Final  . Staphylococcus aureus 06/24/2018  POSITIVE* NEGATIVE Final   Comment: (NOTE) The Xpert SA Assay (FDA approved for NASAL specimens in patients 71 years of age and older), is one component of a comprehensive surveillance program. It is not intended to diagnose infection nor to guide or monitor treatment. Performed at [facility], [address] [place], [place] [address]   . aPTT 06/24/2018 27  24 - 36 seconds Final   Performed at [facility], [address] [place], [place] [address]  . WBC 06/24/2018 13.1* 4.0 - 10.5 K/uL Final  . RBC 06/24/2018 4.77  4.22 - 5.81 MIL/uL Final  . Hemoglobin 06/24/2018 11.1* 13.0 - 17.0 g/dL Final  . HCT 64/40/347402/09/2018 37.3* 39.0 - 52.0 % Final  . MCV 06/24/2018 78.2* 80.0 - 100.0 fL Final  . MCH 06/24/2018 23.3* 26.0 - 34.0 pg Final  . MCHC 06/24/2018 29.8* 30.0 - 36.0 g/dL Final  . RDW 25/95/638702/09/2018 15.4  11.5 - 15.5 % Final  . Platelets 06/24/2018 363  150 - 400 K/uL Final  . nRBC 06/24/2018 0.0  0.0 - 0.2 % Final   Performed at [facility], [address] [place], [place] [address]  . Sodium 06/24/2018 141  135 - 145 mmol/L Final  . Potassium 06/24/2018 4.1  3.5 - 5.1 mmol/L Final  . Chloride 06/24/2018 106  98 - 111 mmol/L Final  . CO2  06/24/2018 28  22 - 32 mmol/L Final  . Glucose, Bld 06/24/2018 91  70 - 99 mg/dL Final  . BUN 40/98/1191 18  8 - 23 mg/dL Final  . Creatinine, Ser 06/24/2018 1.17  0.61 - 1.24 mg/dL Final  . Calcium 47/82/9562 9.0  8.9 - 10.3 mg/dL Final  . Total Protein 06/24/2018 7.1  6.5 - 8.1 g/dL Final  . Albumin 13/12/6576 4.0  3.5 - 5.0 g/dL Final  . AST 46/96/2952 15  15 - 41 U/L Final  . ALT 06/24/2018 16  0 - 44 U/L Final  . Alkaline Phosphatase 06/24/2018 70  38 - 126 U/L Final  . Total Bilirubin 06/24/2018 0.2* 0.3 - 1.2 mg/dL Final  . GFR calc non Af Amer 06/24/2018 >60  >60 mL/min Final  . GFR calc Af Amer 06/24/2018 >60  >60 mL/min Final  . Anion gap 06/24/2018 7  5 - 15 Final   Performed  at [facility], [address] [place], [place] [address]  . Prothrombin Time 06/24/2018 13.2  11.4 - 15.2 seconds Final  . INR 06/24/2018 1.01   Final   Performed at [facility], [address] [place], [place] [address]  . ABO/RH(D) 06/24/2018 O NEG   Final  . Antibody Screen 06/24/2018 NEG   Final  . Sample Expiration 06/24/2018 07/04/2018   Final  . Extend sample reason 06/24/2018    Final                   Value:NO TRANSFUSIONS OR PREGNANCY IN THE PAST 3 MONTHS Performed at [facility], [address] [place], [place] [address]   . ABO/RH(D) 06/24/2018    Final                   Value:O NEG Performed at [facility], [address], [place], [place] [address]      X-Rays:Dg Pelvis Portable  Result Date: 07/01/2018 CLINICAL DATA:  Post hip replacement EXAM: PORTABLE PELVIS 1-2 VIEWS COMPARISON:  Portable exam 1504 hours compared to earlier intraoperative image of 07/01/2018 FINDINGS: Components of a LEFT hip prosthesis are identified. No fracture, dislocation, or bone destruction. Overlying surgical drain and postsurgical soft tissue changes. IMPRESSION: LEFT hip prosthesis without acute complication. Electronically Signed   By: Ulyses Southward M.D.   On: 07/01/2018 17:24   Dg C-arm 1-60 Min-no Report  Result Date: 07/01/2018 Fluoroscopy was utilized by the requesting physician.  No radiographic interpretation.   Dg Hip Operative Unilat W Or W/o Pelvis Left  Result Date: 07/01/2018 CLINICAL DATA:  LEFT total hip arthroplasty EXAM: OPERATIVE LEFT HIP (WITH PELVIS IF PERFORMED) 2 VIEWS TECHNIQUE: Fluoroscopic spot image(s) were submitted for interpretation post-operatively. COMPARISON:  03/03/2018 radiographs FINDINGS: Two intraoperative views of the LEFT hip are submitted postoperatively for interpretation. LEFT total hip arthroplasty identified without definite complicating features. IMPRESSION: LEFT total  hip arthroplasty without definite complicating features. Electronically Signed   By: Harmon Pier M.D.   On: 07/01/2018 15:03    EKG: Orders placed or performed during the hospital encounter of 07/01/18  . EKG 12-Lead  . EKG 12-Lead  . EKG 12-Lead  . EKG 12-Lead  . EKG 12-Lead  . EKG 12-Lead     Hospital Course: Douglas Swindler. is a 73 y.o. who was admitted to [facility]. They were brought to the operating room on 07/01/2018 and underwent Procedure(s): TOTAL HIP ARTHROPLASTY ANTERIOR APPROACH.  Patient tolerated  the procedure well and was later transferred to the recovery room and then to the orthopaedic floor for postoperative care. They were given PO and IV analgesics for pain control following their surgery. They were given 24 hours of postoperative antibiotics of  Anti-infectives (From admission, onward)   Start     Dose/Rate Route Frequency Ordered Stop   07/01/18 1800  ceFAZolin (ANCEF) IVPB 2g/100 mL premix     2 g 200 mL/hr over 30 Minutes Intravenous Every 6 hours 07/01/18 1649 07/02/18 0028   07/01/18 1145  ceFAZolin (ANCEF) IVPB 2g/100 mL premix     2 g 200 mL/hr over 30 Minutes Intravenous On call to O.R. 07/01/18 1137 07/01/18 1300     and started on DVT prophylaxis in the form of Aspirin.   PT and OT were ordered for total joint protocol. Discharge planning consulted to help with postop disposition and equipment needs. Patient had a good night on the evening of surgery. They started to get up OOB with therapy on POD #1. Hemovac drain was pulled without difficulty on day one. Continued to work with therapy into POD #2. Pt had syncopal episode with physical therapy on day one, discharge plan was revised for patient to remain in hospital until day two for reevaluation. On POD #2 dressing was changed and the incision was clean, dry, and intact with no drainage. Pt was being discharged from the hospital and transferred via wheelchair when he experienced another syncopal  episode. Patient was brought back to his bed and medical service was consulted. 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 Patient continued to work with therapy through the weekend. Had a third syncopal episode on Sunday (POD #4) while trying to use the bathroom. Patient was seen during rounds on POD #5 and was ready to go home. Hemoglobin was noted to have dropped to 7.7, sent patient home with prescription for ferrous sulfate BID x 2 weeks. Medical team signed off on patient, suggesting close follow-up with neurology upon discharge. Incision was healing well. He worked with therapy for one additional session and was meeting his goals. He was discharged to home later that day in stable condition.  Diet: Regular diet Activity: WBAT Follow-up: in 2 weeks Disposition: Home with HEP Discharged Condition: stable   Discharge Instructions    Ambulatory referral to Neurology   Complete by:  As directed    An appointment is requested in approximately: 2 weeks For recurrent syncope   Call MD / Call 911   Complete by:  As directed    If you experience chest pain or shortness of breath, CALL 911 and be transported to the hospital emergency room.  If you develope a fever above 101 F, pus (white drainage) or increased drainage or redness at the wound, or calf pain, call your surgeon's office.   Change dressing   Complete by:  As directed    You may change your dressing on Friday, then change the dressing daily with sterile 4 x 4 inch gauze dressing and paper tape.   Constipation Prevention   Complete by:  As directed    Drink plenty of fluids.  Prune juice may be helpful.  You may use a stool softener, such as Colace (over the counter) 100 mg twice a day.  Use MiraLax (over the counter) for constipation as needed.   Diet - low sodium heart healthy   Complete by:  As directed  Discharge instructions   Complete by:  As directed    Dr.  Ollen GrossFrank Aluisio Total Joint Specialist [facility] [address] [place], [place] [address] [phone]  ANTERIOR APPROACH TOTAL HIP REPLACEMENT POSTOPERATIVE DIRECTIONS   Hip Rehabilitation, Guidelines Following Surgery  The results of a hip operation are greatly improved after range of motion and muscle strengthening exercises. Follow all safety measures which are given to protect your hip. If any of these exercises cause increased pain or swelling in your joint, decrease the amount until you are comfortable again. Then slowly increase the exercises. Call your caregiver if you have problems or questions.   HOME CARE INSTRUCTIONS  Remove items at home which could result in a fall. This includes throw rugs or furniture in walking pathways.  ICE to the affected hip every three hours for 30 minutes at a time and then as needed for pain and swelling.  Continue to use ice on the hip for pain and swelling from surgery. You may notice swelling that will progress down to the foot and ankle.  This is normal after surgery.  Elevate the leg when you are not up walking on it.   Continue to use the breathing machine which will help keep your temperature down.  It is common for your temperature to cycle up and down following surgery, especially at night when you are not up moving around and exerting yourself.  The breathing machine keeps your lungs expanded and your temperature down.  DIET You may resume your previous home diet once your are discharged from the hospital.  DRESSING / WOUND CARE / SHOWERING You may shower 3 days after surgery, but keep the wounds dry during showering.  You may use an occlusive plastic wrap (Press'n Seal for example), NO SOAKING/SUBMERGING IN THE BATHTUB.  If the bandage gets wet, change with a clean dry gauze.  If the incision gets wet, pat the wound dry with a clean towel. You may start showering once you are discharged home but do not submerge the incision  under water. Just pat the incision dry and apply a dry gauze dressing on daily. Change the surgical dressing daily and reapply a dry dressing each time.  ACTIVITY Walk with your walker as instructed. Use walker as long as suggested by your caregivers. Avoid periods of inactivity such as sitting longer than an hour when not asleep. This helps prevent blood clots.  You may resume a sexual relationship in one month or when given the OK by your doctor.  You may return to work once you are cleared by your doctor.  Do not drive a car for 6 weeks or until released by you surgeon.  Do not drive while taking narcotics.  WEIGHT BEARING Weight bearing as tolerated with assist device (walker, cane, etc) as directed, use it as long as suggested by your surgeon or therapist, typically at least 4-6 weeks.  POSTOPERATIVE CONSTIPATION PROTOCOL Constipation - defined medically as fewer than three stools per week and severe constipation as less than one stool per week.  One of the most common issues patients have following surgery is constipation.  Even if you have a regular bowel pattern at home, your normal regimen is likely to be disrupted due to multiple reasons following surgery.  Combination of anesthesia, postoperative narcotics, change in appetite and fluid intake all can affect your bowels.  In order to avoid complications following surgery, here are some recommendations in order to help you during your recovery  period.  Colace (docusate) - Pick up an over-the-counter form of Colace or another stool softener and take twice a day as long as you are requiring postoperative pain medications.  Take with a full glass of water daily.  If you experience loose stools or diarrhea, hold the colace until you stool forms back up.  If your symptoms do not get better within 1 week or if they get worse, check with your doctor.  Dulcolax (bisacodyl) - Pick up over-the-counter and take as directed by the product  packaging as needed to assist with the movement of your bowels.  Take with a full glass of water.  Use this product as needed if not relieved by Colace only.   MiraLax (polyethylene glycol) - Pick up over-the-counter to have on hand.  MiraLax is a solution that will increase the amount of water in your bowels to assist with bowel movements.  Take as directed and can mix with a glass of water, juice, soda, coffee, or tea.  Take if you go more than two days without a movement. Do not use MiraLax more than once per day. Call your doctor if you are still constipated or irregular after using this medication for 7 days in a row.  If you continue to have problems with postoperative constipation, please contact the office for further assistance and recommendations.  If you experience "the worst abdominal pain ever" or develop nausea or vomiting, please contact the office immediatly for further recommendations for treatment.  ITCHING  If you experience itching with your medications, try taking only a single pain pill, or even half a pain pill at a time.  You can also use Benadryl over the counter for itching or also to help with sleep.   TED HOSE STOCKINGS Wear the elastic stockings on both legs for three weeks following surgery during the day but you may remove then at night for sleeping.  MEDICATIONS See your medication summary on the "After Visit Summary" that the nursing staff will review with you prior to discharge.  You may have some home medications which will be placed on hold until you complete the course of blood thinner medication.  It is important for you to complete the blood thinner medication as prescribed by your surgeon.  Continue your approved medications as instructed at time of discharge.  PRECAUTIONS If you experience chest pain or shortness of breath - call 911 immediately for transfer to the hospital emergency department.  If you develop a fever greater that 101 F, purulent drainage  from wound, increased redness or drainage from wound, foul odor from the wound/dressing, or calf pain - CONTACT YOUR SURGEON.                                                   FOLLOW-UP APPOINTMENTS Make sure you keep all of your appointments after your operation with your surgeon and caregivers. You should call the office at the above phone number and make an appointment for approximately two weeks after the date of your surgery or on the date instructed by your surgeon outlined in the "After Visit Summary".  RANGE OF MOTION AND STRENGTHENING EXERCISES  These exercises are designed to help you keep full movement of your hip joint. Follow your caregiver's or physical therapist's instructions. Perform all exercises about fifteen times, three times per day  or as directed. Exercise both hips, even if you have had only one joint replacement. These exercises can be done on a training (exercise) mat, on the floor, on a table or on a bed. Use whatever works the best and is most comfortable for you. Use music or television while you are exercising so that the exercises are a pleasant break in your day. This will make your life better with the exercises acting as a break in routine you can look forward to.  Lying on your back, slowly slide your foot toward your buttocks, raising your knee up off the floor. Then slowly slide your foot back down until your leg is straight again.  Lying on your back spread your legs as far apart as you can without causing discomfort.  Lying on your side, raise your upper leg and foot straight up from the floor as far as is comfortable. Slowly lower the leg and repeat.  Lying on your back, tighten up the muscle in the front of your thigh (quadriceps muscles). You can do this by keeping your leg straight and trying to raise your heel off the floor. This helps strengthen the largest muscle supporting your knee.  Lying on your back, tighten up the muscles of your buttocks both with the  legs straight and with the knee bent at a comfortable angle while keeping your heel on the floor.   IF YOU ARE TRANSFERRED TO A SKILLED REHAB FACILITY If the patient is transferred to a skilled rehab facility following release from the hospital, a list of the current medications will be sent to the facility for the patient to continue.  When discharged from the skilled rehab facility, please have the facility set up the patient's Home Health Physical Therapy prior to being released. Also, the skilled facility will be responsible for providing the patient with their medications at time of release from the facility to include their pain medication, the muscle relaxants, and their blood thinner medication. If the patient is still at the rehab facility at time of the two week follow up appointment, the skilled rehab facility will also need to assist the patient in arranging follow up appointment in our office and any transportation needs.  MAKE SURE YOU:  Understand these instructions.  Get help right away if you are not doing well or get worse.    Pick up stool softner and laxative for home use following surgery while on pain medications. Do not submerge incision under water. Please use good hand washing techniques while changing dressing each day. May shower starting three days after surgery. Please use a clean towel to pat the incision dry following showers. Continue to use ice for pain and swelling after surgery. Do not use any lotions or creams on the incision until instructed by your surgeon.   Do not sit on low chairs, stoools or toilet seats, as it may be difficult to get up from low surfaces   Complete by:  As directed    Driving restrictions   Complete by:  As directed    No driving for two weeks   TED hose   Complete by:  As directed    Use stockings (TED hose) for three weeks on both leg(s).  You may remove them at night for sleeping.   Weight bearing as tolerated   Complete by:  As  directed      Allergies as of 07/06/2018      Reactions   Divalproex Sodium Swelling  Leg swell   Topamax [topiramate] Other (See Comments)   Lowers blood pressure      Medication List    STOP taking these medications   aspirin 81 MG tablet Replaced by:  aspirin 325 MG EC tablet   ibuprofen 800 MG tablet Commonly known as:  ADVIL,MOTRIN     TAKE these medications   amitriptyline 25 MG tablet Commonly known as:  ELAVIL Take 25 mg by mouth at bedtime.   amLODipine 10 MG tablet Commonly known as:  NORVASC Take 10 mg by mouth daily. What changed:  Another medication with the same name was removed. Continue taking this medication, and follow the directions you see here.   aspirin 325 MG EC tablet Take 1 tablet (325 mg total) by mouth 2 (two) times daily for 20 days. Then take one baby Aspirin (81 mg) once a day for three weeks.Then discontinue aspirin. Replaces:  aspirin 81 MG tablet   clonazePAM 1 MG tablet Commonly known as:  KLONOPIN Take 1 mg by mouth at bedtime.   diazepam 5 MG tablet Commonly known as:  VALIUM Take 5 mg by mouth every morning.   docusate sodium 100 MG capsule Commonly known as:  COLACE Take 1 capsule (100 mg total) by mouth 2 (two) times daily for 30 days.   ferrous sulfate 325 (65 FE) MG EC tablet Take 1 tablet (325 mg total) by mouth 2 (two) times daily for 28 doses.   HYDROcodone-acetaminophen 5-325 MG tablet Commonly known as:  NORCO/VICODIN Take 1-2 tablets by mouth every 6 (six) hours as needed for moderate pain (pain score 4-6).   levothyroxine 75 MCG tablet Commonly known as:  SYNTHROID, LEVOTHROID Take 75 mcg by mouth daily before breakfast.   losartan 100 MG tablet Commonly known as:  COZAAR Take 100 mg by mouth daily.   meclizine 25 MG tablet Commonly known as:  ANTIVERT Take 25 mg by mouth 2 (two) times daily.   methocarbamol 500 MG tablet Commonly known as:  ROBAXIN Take 1 tablet (500 mg total) by mouth every 6 (six)  hours as needed for muscle spasms.            Durable Medical Equipment  (From admission, onward)         Start     Ordered   07/02/18 1052  For home use only DME 3 n 1  Once     07/02/18 1051           Discharge Care Instructions  (From admission, onward)         Start     Ordered   07/02/18 0000  Weight bearing as tolerated     07/02/18 0742   07/02/18 0000  Change dressing    Comments:  You may change your dressing on Friday, then change the dressing daily with sterile 4 x 4 inch gauze dressing and paper tape.   07/02/18 6578         Follow-up Information    Ollen Gross, MD. Schedule an appointment as soon as possible for a visit on 07/16/2018.   Specialty:  Orthopedic Surgery Contact information: [address] [place] [address] [phone]        [facility]. Schedule an appointment as soon as possible for a visit.   Contact information: [address]     [address] [place] [address] [phone]          Signed: Arther Abbott, PA-C Orthopedic Surgery 07/06/2018, 1:28  PM

## 2018-07-06 NOTE — Progress Notes (Signed)
Patient is stable for discharge. Discharge instructions and medications have been reviewed with the patient and all questions answered. AVS and prescriptions given to patient.  

## 2018-07-06 NOTE — Evaluation (Signed)
Physical Therapy Evaluation Patient Details Name: Douglas Brewer. MRN: [id number] DOB: Mar 14, 1946 Today's Date: 07/06/2018   History of Present Illness  73 yo male s/p L DA-THA on 07/01/18. PMH includes vasovagal syncope, anxiety, HTN, vertigo.   Clinical Impression  Pt progression, feeling better overall. Mild dizziness after amb ~55', slightly diaphoretic in after seated in recliner--BP 118/69. Recommend pt amb only short distances and with supervision/assist at home. Will see again for stair training today if wife feels she would like to review again. Would benefit from HHPT given issues this adm but follow MD's recommendations    Follow Up Recommendations Follow surgeon's recommendation for DC plan and follow-up therapies;Supervision for mobility/OOB    Equipment Recommendations  3in1 (PT)    Recommendations for Other Services       Precautions / Restrictions Precautions Precautions: Fall Precaution Comments: h/o vertigo/syncope, takes meclizine BID prophylactically at baseline. Multiple syncopal episodes during hospitalization Restrictions Weight Bearing Restrictions: No Other Position/Activity Restrictions: WBAT       Mobility  Bed Mobility                  Transfers Overall transfer level: Needs assistance Equipment used: Rolling walker (2 wheeled)   Sit to Stand: Min guard         General transfer comment: min/guard with cues for hand placement.  incr time d/t pain  Ambulation/Gait Ambulation/Gait assistance: Min guard;Supervision Gait Distance (Feet): 65 Feet(chair follow for safety) Assistive device: Rolling walker (2 wheeled) Gait Pattern/deviations: Step-to pattern;Antalgic Gait velocity: decr    General Gait Details: Pt with decreased L knee flexion during swing through phase.  dizziness and incr pain reported aftier about 55', pt seated in recliner, mild diaphoresis noted--BP 118/69  Stairs         General stair comments: discussed  technique, pt and wife present for previous instruction--hand out reviewed  Wheelchair Mobility    Modified Rankin (Stroke Patients Only)       Balance           Standing balance support: During functional activity;Bilateral upper extremity supported Standing balance-Leahy Scale: Poor Standing balance comment: reliant on UEs                             Pertinent Vitals/Pain Pain Assessment: 0-10 Pain Score: 4  Pain Location: L hip  Pain Descriptors / Indicators: Sore Pain Intervention(s): Monitored during session;Limited activity within patient's tolerance;Premedicated before session    Home Living                        Prior Function                 Hand Dominance        Extremity/Trunk Assessment                Communication      Cognition Arousal/Alertness: Awake/alert Behavior During Therapy: WFL for tasks assessed/performed Overall Cognitive Status: Within Functional Limits for tasks assessed                                        General Comments      Exercises Total Joint Exercises Ankle Circles/Pumps: AROM;Both;10 reps Long Arc Quad: AROM;Left;10 reps;Seated   Assessment/Plan    PT Assessment    PT Problem List  PT Treatment Interventions      PT Goals (Current goals can be found in the Care Plan section)  Acute Rehab PT Goals Patient Stated Goal: go hunting PT Goal Formulation: With patient Time For Goal Achievement: 07/08/18 Potential to Achieve Goals: Good    Frequency 7X/week   Barriers to discharge        Co-evaluation               AM-PAC PT "6 Clicks" Mobility  Outcome Measure Help needed turning from your back to your side while in a flat bed without using bedrails?: None Help needed moving from lying on your back to sitting on the side of a flat bed without using bedrails?: None Help needed moving to and from a bed to a chair (including a wheelchair)?:  None Help needed standing up from a chair using your arms (e.g., wheelchair or bedside chair)?: A Little Help needed to walk in hospital room?: A Little Help needed climbing 3-5 steps with a railing? : A Lot 6 Click Score: 20    End of Session Equipment Utilized During Treatment: Gait belt Activity Tolerance: Patient tolerated treatment well;Other (comment)(see note) Patient left: in chair;with call bell/phone within reach Nurse Communication: Mobility status PT Visit Diagnosis: Other abnormalities of gait and mobility (R26.89);Difficulty in walking, not elsewhere classified (R26.2)    Time: 7782-4235 PT Time Calculation (min) (ACUTE ONLY): 35 min   Charges:     PT Treatments $Gait Training: 23-37 mins        Drucilla Chalet, PT  Pager: [phone] Acute Rehab Dept [facility]): [phone]   07/06/2018   [facility] 07/06/2018, 10:18 AM

## 2018-07-06 NOTE — Progress Notes (Signed)
PROGRESS NOTE    Yvetta CoderRussell R Allshouse Jr.  ZOX:[id number] DOB: 12/21/1945 DOA: 07/01/2018 PCP: Shelly Rubensteinh Park, Angela J, MD    Brief Narrative:  73 year old male with a history of hypertension, hypothyroidism, anxiety, left hip arthritis, and reflex syncope who presented for a left total hip arthroplasty.  He underwent surgery on February 12.  Plan was for discharge today, but when patient was being transported in a wheelchair he had a syncopal event.  The patient notes 8 years of recurrent episodes of syncope.  He notes that he had not had any issues in about 5 months, but last week had 4-5 dizzy spells.  He describes that at times he has pressure in his left ear preceding this.  Yesterday when he was working with therapy, he felt like someone was putting him to sleep.  He felt vertigo.  This lasted 15 to 20 seconds.  He denies any chest pain, shortness of breath.  He notes lightheadedness.  The symptoms lasted around 20 seconds and stopped.  Today he was ready to be discharged, but when he was in the wheelchair he became pale and fainted for a few seconds.  Medicine was consulted for recurrent syncope.  Family notes that sometimes after these episodes he may be a little bit confused.  They deny seeing any clear seizure-like activity, urinary incontinence, or ever biting his tongue.  He has had extensive evaluations in the past by cardiology, neurology, and ENT.  He feels like he is never been given a clear answer as to what is going on.  He denies smoking or drinking.  Assessment & Plan:   Principal Problem:   OA (osteoarthritis) of hip   1. Syncope: pt long history (he notes 8 years) of recurrent episodes of what appear to be syncope.  He's been seen by cardiology and neurology in the past and undergone extensive testing and workup.  Per chart review, looks like concern was for reflex syncope (or neurally mediated syncope), predominantly vasodepressor type.  Cardiology (2016 notes) did not recommend loop  recorder in the past as no bradycardia seen in prior monitoring.  He saw neurology in 10/2014 (care everywhere) who noted that the etiology of his events was unclear.   1. Suspect this is reflex syncope as previously suspected by cardiology, followed by outpatient Neurology 2. Mucus membranes appear dry although repeat orthostatic vitals are neg 3. This AM, patient reported vertiginous type symptoms for which pt has been on meclizine for several years 4. Neuro exam performed and remains benign 5. Heart monitor reviewed, PVC's noted otherwise unremarkable 6. Hgb stable this AM 7. Recurrent syncope 2/16 after moving bowels. Symptoms began after sharp excruciating pain in post-op hip resulting in diaphoresis, dizziness/lightheadedness then syncope for several seconds. Repeat orthostatics unremarkable. 8. Suspect vasovagal triggered by pain. Given 75cc/hr IVF given dry mucus membranes  9. Today, patient reports feeling well, no further episodes. Patient to follow up closely with Neurology on discharge. I have requested follow up at [facility] 2. EKG shows RBBB that appears similar to priors.  QTc slightly prolonged at 469. Stable   3. Recommend close outpatient follow up with Neurology  4. Discussed that he should not drive until cleared by physician.  Acute Blood Loss Anemia: post op. Hgb today 7.2, pt not orthostatic. Defer to Orthopedic Surgery regarding if/when transfusion is warranted  Leukocytosis: likely reactive, improved  Antimicrobials: Anti-infectives (From admission, onward)   Start     Dose/Rate Route Frequency Ordered Stop   07/01/18 1800  ceFAZolin (ANCEF) IVPB 2g/100 mL premix     2 g 200 mL/hr over 30 Minutes Intravenous Every 6 hours 07/01/18 1649 07/02/18 0028   07/01/18 1145  ceFAZolin (ANCEF) IVPB 2g/100 mL premix     2 g 200 mL/hr over 30 Minutes Intravenous On call to O.R. 07/01/18 1137 07/01/18 1300      Subjective: Syncope noted this AM  Objective: Vitals:   07/05/18  0911 07/05/18 1814 07/05/18 2111 07/06/18 0527  BP: 124/76 (!) 150/85 (!) 156/86 (!) 141/75  Pulse: 67 73 74 76  Resp: 16 18 16 16   Temp:  97.7 F (36.5 C) 97.7 F (36.5 C) 98.2 F (36.8 C)  TempSrc:  Oral Oral   SpO2: 98% 96% 98% 93%  Weight:      Height:        Intake/Output Summary (Last 24 hours) at 07/06/2018 1211 Last data filed at 07/06/2018 0819 Gross per 24 hour  Intake 2006.9 ml  Output 2400 ml  Net -393.1 ml   Filed Weights   07/01/18 1138  Weight: 108.4 kg    Examination: General exam: Conversant, in no acute distress Respiratory system: normal chest rise, clear, no audible wheezing  Data Reviewed: I have personally reviewed following labs and imaging studies  CBC: Recent Labs  Lab 07/02/18 0523 07/03/18 0504 07/04/18 0400 07/05/18 0449 07/06/18 0437  WBC 15.8* 18.5* 12.2* 10.8* 10.0  HGB 9.5* 8.0* 7.2* 7.6* 7.7*  HCT 32.5* 26.7* 24.3* 26.2* 25.9*  MCV 78.5* 76.7* 78.6* 79.6* 77.3*  PLT 277 285 225 264 274   Basic Metabolic Panel: Recent Labs  Lab 07/02/18 0523 07/03/18 0504 07/04/18 0400 07/05/18 0449 07/06/18 0437  NA 137 137 140 140 136  K 3.7 3.6 3.4* 3.9 3.4*  CL 103 105 106 104 102  CO2 25 25 28 29 27   GLUCOSE 164* 132* 106* 96 105*  BUN 13 18 16 16 12   CREATININE 0.91 0.88 0.92 0.97 0.85  CALCIUM 8.5* 8.4* 8.0* 8.4* 8.1*  MG  --   --  2.0  --   --    GFR: Estimated Creatinine Clearance: 101.2 mL/min (by C-G formula based on SCr of 0.85 mg/dL). Liver Function Tests: No results for input(s): AST, ALT, ALKPHOS, BILITOT, PROT, ALBUMIN in the last 168 hours. No results for input(s): LIPASE, AMYLASE in the last 168 hours. No results for input(s): AMMONIA in the last 168 hours. Coagulation Profile: No results for input(s): INR, PROTIME in the last 168 hours. Cardiac Enzymes: No results for input(s): CKTOTAL, CKMB, CKMBINDEX, TROPONINI in the last 168 hours. BNP (last 3 results) No results for input(s): PROBNP in the last 8760  hours. HbA1C: No results for input(s): HGBA1C in the last 72 hours. CBG: Recent Labs  Lab 07/03/18 1356  GLUCAP 101*   Lipid Profile: No results for input(s): CHOL, HDL, LDLCALC, TRIG, CHOLHDL, LDLDIRECT in the last 72 hours. Thyroid Function Tests: No results for input(s): TSH, T4TOTAL, FREET4, T3FREE, THYROIDAB in the last 72 hours. Anemia Panel: No results for input(s): VITAMINB12, FOLATE, FERRITIN, TIBC, IRON, RETICCTPCT in the last 72 hours. Sepsis Labs: No results for input(s): PROCALCITON, LATICACIDVEN in the last 168 hours.  No results found for this or any previous visit (from the past 240 hour(s)).   Radiology Studies: No results found.  Scheduled Meds: . amitriptyline  25 mg Oral QHS  . amLODipine  10 mg Oral Daily  . aspirin EC  325 mg Oral BID  . clonazePAM  1 mg Oral  QHS  . diazepam  5 mg Oral q morning - 10a  . docusate sodium  100 mg Oral BID  . levothyroxine  75 mcg Oral Q0600  . losartan  100 mg Oral Daily  . meclizine  25 mg Oral BID   Continuous Infusions: . sodium chloride Stopped (07/02/18 1511)  . lactated ringers 75 mL/hr at 07/05/18 2353  . methocarbamol (ROBAXIN) IV       LOS: 5 days   Rickey Barbara, MD [facility] Pager On Amion  If 7PM-7AM, please contact night-coverage 07/06/2018, 12:11 PM

## 2018-07-06 NOTE — Progress Notes (Signed)
   Subjective: 5 Days Post-Op Procedure(s) (LRB): TOTAL HIP ARTHROPLASTY ANTERIOR APPROACH (Left) Patient reports pain as mild.   No further episodes after yesterday morning Was able to walk without dizziness/lightheadedness  Objective: Vital signs in last 24 hours: Temp:  [97.7 F (36.5 C)-98.2 F (36.8 C)] 98.2 F (36.8 C) (02/17 0527) Pulse Rate:  [67-89] 76 (02/17 0527) Resp:  [16-18] 16 (02/17 0527) BP: (122-156)/(50-86) 141/75 (02/17 0527) SpO2:  [93 %-98 %] 93 % (02/17 0527)  Intake/Output from previous day:  Intake/Output Summary (Last 24 hours) at 07/06/2018 0711 Last data filed at 07/06/2018 0640 Gross per 24 hour  Intake 2126.9 ml  Output 2475 ml  Net -348.1 ml    Intake/Output this shift: No intake/output data recorded.  Labs: Recent Labs    07/04/18 0400 07/05/18 0449 07/06/18 0437  HGB 7.2* 7.6* 7.7*   Recent Labs    07/05/18 0449 07/06/18 0437  WBC 10.8* 10.0  RBC 3.29* 3.35*  HCT 26.2* 25.9*  PLT 264 274   Recent Labs    07/05/18 0449 07/06/18 0437  NA 140 136  K 3.9 3.4*  CL 104 102  CO2 29 27  BUN 16 12  CREATININE 0.97 0.85  GLUCOSE 96 105*  CALCIUM 8.4* 8.1*   No results for input(s): LABPT, INR in the last 72 hours.  EXAM General - Patient is Alert, Appropriate and Oriented Extremity - Neurologically intact Neurovascular intact Incision: dressing C/D/I No cellulitis present Compartment soft Dressing/Incision - clean, dry, no drainage Motor Function - intact, moving foot and toes well on exam.   Past Medical History:  Diagnosis Date  . Anxiety   . Arthritis   . Hypertension   . Hypothyroidism   . Vertigo     Assessment/Plan: 5 Days Post-Op Procedure(s) (LRB): TOTAL HIP ARTHROPLASTY ANTERIOR APPROACH (Left) Principal Problem:   OA (osteoarthritis) of hip   Discharge home after PT  DVT Prophylaxis - Aspirin Weight Bearing As Tolerated left Leg  Ollen Gross 07/06/2018, 7:11 AM

## 2018-07-21 ENCOUNTER — Encounter: Payer: Self-pay | Admitting: Neurology

## 2018-07-21 ENCOUNTER — Ambulatory Visit (INDEPENDENT_AMBULATORY_CARE_PROVIDER_SITE_OTHER): Payer: Medicare Other | Admitting: Neurology

## 2018-07-21 VITALS — BP 149/87 | HR 83 | Ht 71.0 in | Wt 237.0 lb

## 2018-07-21 DIAGNOSIS — R55 Syncope and collapse: Secondary | ICD-10-CM
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
## 2018-07-21 NOTE — Patient Instructions (Addendum)
Vasovagal syncope   Syncope  Syncope refers to a condition in which a person temporarily loses consciousness. Syncope may also be called fainting or passing out. It is caused by a sudden decrease in blood flow to the brain. Even though most causes of syncope are not dangerous, syncope can be a sign of a serious medical problem. Your health care provider may do tests to find the reason why you are having syncope. Signs that you may be about to faint include:  Feeling dizzy or light-headed.  Feeling nauseous.  Seeing all white or all black in your field of vision.  Having cold, clammy skin. If you faint, get medical help right away. Call your local emergency services (911 in the [place] Do not drive yourself to the hospital. Follow these instructions at home: Pay attention to any changes in your symptoms. Take these actions to stay safe and to help relieve your symptoms: Lifestyle  Do not drive, use machinery, or play sports until your health care provider says it is okay.  Do not drink alcohol.  Do not use any products that contain nicotine or tobacco, such as cigarettes and e-cigarettes. If you need help quitting, ask your health care provider.  Drink enough fluid to keep your urine pale yellow. General instructions  Take over-the-counter and prescription medicines only as told by your health care provider.  If you are taking blood pressure or heart medicine, get up slowly and take several minutes to sit and then stand. This can reduce dizziness or light-headedness.  Have someone stay with you until you feel stable.  If you start to feel like you might faint, lie down right away and raise (elevate) your feet above the level of your heart. Breathe deeply and steadily. Wait until all the symptoms have passed.  Keep all follow-up visits as told by your health care provider. This is important. Get help right away if you:  Have a severe headache.  Faint once or repeatedly.  Have  pain in your chest, abdomen, or back.  Have a very fast or irregular heartbeat (palpitations).  Have pain when you breathe.  Are bleeding from your mouth or rectum, or you have black or tarry stool.  Have a seizure.  Are confused.  Have trouble walking.  Have severe weakness.  Have vision problems. These symptoms may represent a serious problem that is an emergency. Do not wait to see if your symptoms will go away. Get medical help right away. Call your local emergency services (911 in the [place] Do not drive yourself to the hospital. Summary  Syncope refers to a condition in which a person temporarily loses consciousness. It is caused by a sudden decrease in blood flow to the brain.  Signs that you may be about to faint include dizziness, feeling light-headed, feeling nauseous, sudden vision changes, or cold, clammy skin.  Although most causes of syncope are not dangerous, syncope can be a sign of a serious medical problem. If you faint, get medical help right away. This information is not intended to replace advice given to you by your health care provider. Make sure you discuss any questions you have with your health care provider. Document Released: 05/06/2005 Document Revised: 04/14/2017 Document Reviewed: 04/14/2017 Elsevier Interactive Patient Education  2019 [employer].

## 2019-05-21 HISTORY — PX: EYE SURGERY: SHX253

## 2020-05-23 NOTE — Patient Instructions (Addendum)
DUE TO COVID-19 ONLY ONE VISITOR IS ALLOWED TO COME WITH YOU AND STAY IN THE WAITING ROOM ONLY DURING PRE OP AND PROCEDURE DAY OF SURGERY. THE 1 VISITOR  MAY VISIT WITH YOU AFTER SURGERY IN YOUR PRIVATE ROOM DURING VISITING HOURS ONLY!   Covid test 06/01/20 11:00______, THIS TEST MUST BE DONE BEFORE SURGERY,  COVID TESTING SITE [address] [place] [address], IT IS ON THE RIGHT GOING OUT [address] APPROXIMATELY  2 MINUTES PAST [employer] ON THE RIGHT. ONCE YOUR COVID TEST IS COMPLETED,  PLEASE BEGIN THE QUARANTINE INSTRUCTIONS AS OUTLINED IN YOUR HANDOUT.                [facility].    Your procedure is scheduled on: 06/05/20   Report to [facility] Main  Entrance   Report to admitting at   10:10 AM     Call this number if you have problems the morning of surgery [phone]    BRUSH YOUR TEETH MORNING OF SURGERY AND RINSE YOUR MOUTH OUT, NO CHEWING GUM CANDY OR MINTS.   No food after midnight.    You may have clear liquid until 9:30 AM.    At 9:00  AM drink pre surgery drink.    Nothing by mouth after 9:30 AM.    Take these medicines the morning of surgery with A SIP OF WATER: Valium if needed, Levothyrovine                                 You may not have any metal on your body including              piercings  Do not wear jewelry,  lotions, powders or deodorant              Men may shave face and neck.   Do not bring valuables to the [facility]. [name] IS NOT             RESPONSIBLE   FOR VALUABLES.  Contacts, dentures or bridgework may not be worn into surgery.                  Please read over the following fact sheets you were given: _____________________________________________________________________             [facility] - Preparing for Surgery Before surgery, you can play an important role.   Because skin is not sterile, your skin needs to be as free of germs as possible .  You can reduce the number of  germs on your skin by washing with CHG (chlorahexidine gluconate) soap before surgery.   CHG is an antiseptic cleaner which kills germs and bonds with the skin to continue killing germs even after washing. Please DO NOT use if you have an allergy to CHG or antibacterial soaps.   If your skin becomes reddened/irritated stop using the CHG and inform your nurse when you arrive at Short Stay.   You may shave your face/neck.  Please follow these instructions carefully:  1.  Shower with CHG Soap the night before surgery and the  morning of Surgery.  2.  If you choose to wash your hair, wash your hair first as usual with your  normal  shampoo.  3.  After you shampoo, rinse your hair and body thoroughly to remove the  shampoo.  4.  Use CHG as you would any other liquid soap.  You can apply chg directly  to the skin and wash                       Gently with a scrungie or clean washcloth.  5.  Apply the CHG Soap to your body ONLY FROM THE NECK DOWN.   Do not use on face/ open                           Wound or open sores. Avoid contact with eyes, ears mouth and genitals (private parts).                       Wash face,  Genitals (private parts) with your normal soap.             6.  Wash thoroughly, paying special attention to the area where your surgery  will be performed.  7.  Thoroughly rinse your body with warm water from the neck down.  8.  DO NOT shower/wash with your normal soap after using and rinsing off  the CHG Soap.             9.  Pat yourself dry with a clean towel.            10.  Wear clean pajamas.            11.  Place clean sheets on your bed the night of your first shower and do not  sleep with pets. Day of Surgery : Do not apply any lotions/deodorants the morning of surgery.  Please wear clean clothes to the [facility].  FAILURE TO FOLLOW THESE INSTRUCTIONS MAY RESULT IN THE CANCELLATION OF YOUR SURGERY PATIENT  SIGNATURE_________________________________  NURSE SIGNATURE__________________________________  ________________________________________________________________________   [facility]  An incentive spirometer is a tool that can help keep your lungs clear and active. This tool measures how well you are filling your lungs with each breath. Taking long deep breaths may help reverse or decrease the chance of developing breathing (pulmonary) problems (especially infection) following:  A long period of time when you are unable to move or be active. BEFORE THE PROCEDURE   If the spirometer includes an indicator to show your best effort, your nurse or respiratory therapist will set it to a desired goal.  If possible, sit up straight or lean slightly forward. Try not to slouch.  Hold the incentive spirometer in an upright position. INSTRUCTIONS FOR USE  1. Sit on the edge of your bed if possible, or sit up as far as you can in bed or on a chair. 2. Hold the incentive spirometer in an upright position. 3. Breathe out normally. 4. Place the mouthpiece in your mouth and seal your lips tightly around it. 5. Breathe in slowly and as deeply as possible, raising the piston or the ball toward the top of the column. 6. Hold your breath for 3-5 seconds or for as long as possible. Allow the piston or ball to fall to the bottom of the column. 7. Remove the mouthpiece from your mouth and breathe out normally. 8. Rest for a few seconds and repeat Steps 1 through 7 at least 10 times every 1-2 hours when you are awake. Take your time and take a few normal breaths between deep breaths. 9. The spirometer may include an indicator to show your best effort.  Use the indicator as a goal to work toward during each repetition. 10. After each set of 10 deep breaths, practice coughing to be sure your lungs are clear. If you have an incision (the cut made at the time of surgery), support your incision when coughing  by placing a pillow or rolled up towels firmly against it. Once you are able to get out of bed, walk around indoors and cough well. You may stop using the incentive spirometer when instructed by your caregiver.  RISKS AND COMPLICATIONS  Take your time so you do not get dizzy or light-headed.  If you are in pain, you may need to take or ask for pain medication before doing incentive spirometry. It is harder to take a deep breath if you are having pain. AFTER USE  Rest and breathe slowly and easily.  It can be helpful to keep track of a log of your progress. Your caregiver can provide you with a simple table to help with this. If you are using the spirometer at home, follow these instructions: Reserve IF:   You are having difficultly using the spirometer.  You have trouble using the spirometer as often as instructed.  Your pain medication is not giving enough relief while using the spirometer.  You develop fever of 100.5 F (38.1 C) or higher. SEEK IMMEDIATE MEDICAL CARE IF:   You cough up bloody sputum that had not been present before.  You develop fever of 102 F (38.9 C) or greater.  You develop worsening pain at or near the incision site. MAKE SURE YOU:   Understand these instructions.  Will watch your condition.  Will get help right away if you are not doing well or get worse. Document Released: 09/16/2006 Document Revised: 07/29/2011 Document Reviewed: 11/17/2006 [facility] Patient Information 2014 [place], [place].   ________________________________________________________________________

## 2020-05-24 ENCOUNTER — Encounter (HOSPITAL_COMMUNITY): Payer: Self-pay

## 2020-05-24 ENCOUNTER — Encounter (HOSPITAL_COMMUNITY)
Admission: RE | Admit: 2020-05-24 | Discharge: 2020-05-24 | Disposition: A | Discharge status: Home / Self Care | Payer: Medicare Other | Source: Ambulatory Visit | Attending: Orthopedic Surgery | Admitting: Orthopedic Surgery

## 2020-05-24 ENCOUNTER — Other Ambulatory Visit: Payer: Self-pay

## 2020-05-24 DIAGNOSIS — I10 Essential (primary) hypertension: Secondary | ICD-10-CM | POA: Insufficient documentation

## 2020-05-24 DIAGNOSIS — Z01818 Encounter for other preprocedural examination: Secondary | ICD-10-CM | POA: Insufficient documentation

## 2020-05-24 LAB — COMPREHENSIVE METABOLIC PANEL
ALT: 24 U/L (ref 0–44)
AST: 18 U/L (ref 15–41)
Albumin: 4.5 g/dL (ref 3.5–5.0)
Alkaline Phosphatase: 62 U/L (ref 38–126)
Anion gap: 11 (ref 5–15)
BUN: 25 mg/dL — ABNORMAL HIGH (ref 8–23)
CO2: 28 mmol/L (ref 22–32)
Calcium: 9.3 mg/dL (ref 8.9–10.3)
Chloride: 99 mmol/L (ref 98–111)
Creatinine, Ser: 1.36 mg/dL — ABNORMAL HIGH (ref 0.61–1.24)
GFR, Estimated: 55 mL/min — ABNORMAL LOW (ref >60)
Glucose, Bld: 105 mg/dL — ABNORMAL HIGH (ref 70–99)
Potassium: 3.9 mmol/L (ref 3.5–5.1)
Sodium: 138 mmol/L (ref 135–145)
Total Bilirubin: 0.6 mg/dL (ref 0.3–1.2)
Total Protein: 7.8 g/dL (ref 6.5–8.1)

## 2020-05-24 LAB — PROTIME-INR
INR: 1 (ref 0.8–1.2)
Prothrombin Time: 12.3 seconds (ref 11.4–15.2)

## 2020-05-24 LAB — CBC
HCT: 44.5 % (ref 39.0–52.0)
Hemoglobin: 14.8 g/dL (ref 13.0–17.0)
MCH: 29.1 pg (ref 26.0–34.0)
MCHC: 33.3 g/dL (ref 30.0–36.0)
MCV: 87.4 fL (ref 80.0–100.0)
Platelets: 268 10*3/uL (ref 150–400)
RBC: 5.09 MIL/uL (ref 4.22–5.81)
RDW: 13 % (ref 11.5–15.5)
WBC: 13.2 10*3/uL — ABNORMAL HIGH (ref 4.0–10.5)
nRBC: 0 % (ref 0.0–0.2)

## 2020-05-24 LAB — SURGICAL PCR SCREEN
MRSA, PCR: NEGATIVE
Staphylococcus aureus: POSITIVE — AB

## 2020-05-24 LAB — TYPE AND SCREEN
ABO/RH(D): O NEG
Antibody Screen: NEGATIVE

## 2020-05-24 LAB — APTT: aPTT: 27 seconds (ref 24–36)

## 2020-05-24 NOTE — Progress Notes (Signed)
COVID Vaccine Completed:Yes Date COVID Vaccine completed:08/02/20 Booster was Pfizer 04/27/20 COVID vaccine manufacturer:    Moderna     PCP - Dr. Harrie Foreman Cardiologist - no  Chest x-ray - no EKG - 05/24/20-chart, epic Stress Test - no ECHO - 2015-epic. For  vertigo after hip surgery Cardiac Cath - no Pacemaker/ICD device last checked:NA  Sleep Study - no CPAP -   Fasting Blood Sugar - NA Checks Blood Sugar _____ times a day  Blood Thinner Instructions:[facility]/Dr. A. Park Aspirin Instructions:Stop5 days prior to DOS/ Dr. Harrie Foreman Last Dose:05/30/20  Anesthesia review:   Patient denies shortness of breath, fever, cough and chest pain at PAT appointment  yes Patient verbalized understanding of instructions that were given to them at the PAT appointment. Patient was also instructed that they will need to review over the PAT instructions again at home before surgery.yes Pt is able to climb 3-4 flights of stairs, work and ADLs without any SOB

## 2020-06-01 ENCOUNTER — Other Ambulatory Visit (HOSPITAL_COMMUNITY)
Admission: RE | Admit: 2020-06-01 | Discharge: 2020-06-01 | Disposition: A | Discharge status: Home / Self Care | Payer: Medicare Other | Source: Ambulatory Visit | Attending: Orthopedic Surgery | Admitting: Orthopedic Surgery

## 2020-06-01 DIAGNOSIS — Z20822 Contact with and (suspected) exposure to covid-19: Secondary | ICD-10-CM | POA: Diagnosis not present

## 2020-06-01 DIAGNOSIS — Z01812 Encounter for preprocedural laboratory examination: Secondary | ICD-10-CM | POA: Insufficient documentation

## 2020-06-01 NOTE — H&P (Signed)
TOTAL KNEE ADMISSION H&P  Patient is being admitted for right total knee arthroplasty.  Subjective:  Chief Complaint:right knee pain.  HPI: Douglas Brewer., 75 y.o. male, has a history of pain and functional disability in the right knee due to arthritis and has failed non-surgical conservative treatments for greater than 12 weeks to includecorticosteriod injections and activity modification.  Onset of symptoms was gradual, starting 2 years ago with gradually worsening course since that time. The patient noted no past surgery on the right knee(s).  Patient currently rates pain in the right knee(s) at 7 out of 10 with activity. Patient has worsening of pain with activity and weight bearing and pain that interferes with activities of daily living.  Patient has evidence of joint space narrowing by imaging studies.There is no active infection.  Patient Active Problem List   Diagnosis Date Noted  . OA (osteoarthritis) of hip 07/01/2018  . Vasovagal syncope 05/16/2014   Past Medical History:  Diagnosis Date  . Anxiety   . Arthritis   . History of kidney stones 1998  . Hypertension   . Hypothyroidism   . Peripheral vascular disease (HCC) 2015   poor venous return in legs  . Vertigo     Past Surgical History:  Procedure Laterality Date  . EYE SURGERY Bilateral 2021  . JOINT REPLACEMENT Left 2020  . TONSILLECTOMY    . TOTAL HIP ARTHROPLASTY Left 07/01/2018   Procedure: TOTAL HIP ARTHROPLASTY ANTERIOR APPROACH;  Surgeon: Ollen Gross, MD;  Location: [facility];  Service: Orthopedics;  Laterality: Left;     No current facility-administered medications for this encounter.   Current Outpatient Medications  Medication Sig Dispense Refill Last Dose  . amitriptyline (ELAVIL) 25 MG tablet Take 25 mg by mouth at bedtime.     Marland Kitchen amLODipine (NORVASC) 10 MG tablet Take 10 mg by mouth at bedtime.     Marland Kitchen aspirin EC 81 MG tablet Take 81 mg by mouth every other day.     . clonazePAM (KLONOPIN)  1 MG tablet Take 1 mg by mouth at bedtime.      . diazepam (VALIUM) 5 MG tablet Take 5 mg by mouth daily.     . furosemide (LASIX) 40 MG tablet Take 40 mg by mouth daily.     Marland Kitchen ibuprofen (ADVIL,MOTRIN) 800 MG tablet Take 800 mg by mouth every 8 (eight) hours as needed for moderate pain.     Marland Kitchen levothyroxine (SYNTHROID, LEVOTHROID) 75 MCG tablet Take 75 mcg by mouth daily before breakfast.      . losartan (COZAAR) 100 MG tablet Take 100 mg by mouth daily.     . meclizine (ANTIVERT) 25 MG tablet Take 25 mg by mouth 2 (two) times daily.     . potassium chloride (KLOR-CON) 10 MEQ tablet Take 10 mEq by mouth daily.      Allergies  Allergen Reactions  . Divalproex Sodium Swelling    Leg swell  . Topamax [Topiramate] Other (See Comments)    Lowers blood pressure     Social History   Tobacco Use  . Smoking status: Never Smoker  . Smokeless tobacco: Former Neurosurgeon    Types: Chew  Substance Use Topics  . Alcohol use: No    Alcohol/week: 0.0 standard drinks    Family History  Problem Relation Age of Onset  . Stroke Mother   . Heart disease Father   . Heart attack Father   . Diabetes Father   . Hypertension Brother  Review of Systems  Constitutional: Negative for chills and fever.  Respiratory: Negative for cough and shortness of breath.   Cardiovascular: Negative for chest pain.  Gastrointestinal: Negative for nausea and vomiting.  Musculoskeletal: Positive for arthralgias.    Objective:  Physical Exam Patient is a 75 year old male.  Well nourished and well developed. General: Alert and oriented x3, cooperative and pleasant, no acute distress. Head: normocephalic, atraumatic, neck supple. Eyes: EOMI. Respiratory: breath sounds clear in all fields, no wheezing, rales, or rhonchi. Cardiovascular: Regular rate and rhythm, no murmurs, gallops or rubs.  Musculoskeletal: Right Knee Exam: No effusion. No Swelling. Range of motion is 5 to 130 degrees. Marked crepitus on range  of motion of the knee. Positive medial joint line tenderness No lateral joint line tenderness. Stable knee.  Calves soft and nontender. Motor function intact in LE. Strength 5/5 LE bilaterally. Neuro: Distal pulses 2+. Sensation to light touch intact in LE.  Vital signs in last 24 hours:    Labs:   Estimated body mass index is 33.05 kg/m as calculated from the following:   Height as of 07/21/18: 5\' 11"  (1.803 m).   Weight as of 07/21/18: 107.5 kg.   Imaging Review Plain radiographs demonstrate severe degenerative joint disease of the right knee(s). The overall alignment isneutral. The bone quality appears to be adequate for age and reported activity level.    Assessment/Plan:  End stage arthritis, right knee   The patient history, physical examination, clinical judgment of the provider and imaging studies are consistent with end stage degenerative joint disease of the right knee(s) and total knee arthroplasty is deemed medically necessary. The treatment options including medical management, injection therapy arthroscopy and arthroplasty were discussed at length. The risks and benefits of total knee arthroplasty were presented and reviewed. The risks due to aseptic loosening, infection, stiffness, patella tracking problems, thromboembolic complications and other imponderables were discussed. The patient acknowledged the explanation, agreed to proceed with the plan and consent was signed. Patient is being admitted for inpatient treatment for surgery, pain control, PT, OT, prophylactic antibiotics, VTE prophylaxis, progressive ambulation and ADL's and discharge planning. The patient is planning to be discharged home.   Therapy Plans: outpatient therapy at [facility] in [place] Disposition: Home with Wife Planned DVT Prophylaxis: aspirin 325mg  BID DME needed: none PCP: Angela Antipin, clearance received TXA: IV Allergies: Topimax - hypertension Anesthesia Concerns: none BMI: 34.9 Not  diabetic.  Other: Oxycodone is okay. - Takes clonazepam PM and diazepam in AM - Has hx of vertigo   Patient's anticipated LOS is less than 2 midnights, meeting these requirements: - Younger than 64 - Lives within 1 hour of care - Has a competent adult at home to recover with post-op recover - NO history of  - Chronic pain requiring opiods  - Diabetes  - Coronary Artery Disease  - Heart failure  - Heart attack  - Stroke  - DVT/VTE  - Cardiac arrhythmia  - Respiratory Failure/COPD  - Renal failure  - Anemia  - Advanced Liver disease   - Patient was instructed on what medications to stop prior to surgery. - Follow-up visit in 2 weeks with Dr. - Begin physical therapy following surgery - Pre-operative lab work as pre-surgical testing - Prescriptions will be provided in hospital at time of discharge  78, PA-C Orthopedic Surgery [facility] [place] [phone]

## 2020-06-01 NOTE — H&P (View-Only) (Signed)
TOTAL KNEE ADMISSION H&P  Patient is being admitted for right total knee arthroplasty.  Subjective:  Chief Complaint:right knee pain.  HPI: Douglas Brewer., 75 y.o. male, has a history of pain and functional disability in the right knee due to arthritis and has failed non-surgical conservative treatments for greater than 12 weeks to includecorticosteriod injections and activity modification.  Onset of symptoms was gradual, starting 2 years ago with gradually worsening course since that time. The patient noted no past surgery on the right knee(s).  Patient currently rates pain in the right knee(s) at 7 out of 10 with activity. Patient has worsening of pain with activity and weight bearing and pain that interferes with activities of daily living.  Patient has evidence of joint space narrowing by imaging studies.There is no active infection.  Patient Active Problem List   Diagnosis Date Noted  . OA (osteoarthritis) of hip 07/01/2018  . Vasovagal syncope 05/16/2014   Past Medical History:  Diagnosis Date  . Anxiety   . Arthritis   . History of kidney stones 1998  . Hypertension   . Hypothyroidism   . Peripheral vascular disease (HCC) 2015   poor venous return in legs  . Vertigo     Past Surgical History:  Procedure Laterality Date  . EYE SURGERY Bilateral 2021  . JOINT REPLACEMENT Left 2020  . TONSILLECTOMY    . TOTAL HIP ARTHROPLASTY Left 07/01/2018   Procedure: TOTAL HIP ARTHROPLASTY ANTERIOR APPROACH;  Surgeon: Ollen Gross, MD;  Location: [facility];  Service: Orthopedics;  Laterality: Left;     No current facility-administered medications for this encounter.   Current Outpatient Medications  Medication Sig Dispense Refill Last Dose  . amitriptyline (ELAVIL) 25 MG tablet Take 25 mg by mouth at bedtime.     Marland Kitchen amLODipine (NORVASC) 10 MG tablet Take 10 mg by mouth at bedtime.     Marland Kitchen aspirin EC 81 MG tablet Take 81 mg by mouth every other day.     . clonazePAM (KLONOPIN)  1 MG tablet Take 1 mg by mouth at bedtime.      . diazepam (VALIUM) 5 MG tablet Take 5 mg by mouth daily.     . furosemide (LASIX) 40 MG tablet Take 40 mg by mouth daily.     Marland Kitchen ibuprofen (ADVIL,MOTRIN) 800 MG tablet Take 800 mg by mouth every 8 (eight) hours as needed for moderate pain.     Marland Kitchen levothyroxine (SYNTHROID, LEVOTHROID) 75 MCG tablet Take 75 mcg by mouth daily before breakfast.      . losartan (COZAAR) 100 MG tablet Take 100 mg by mouth daily.     . meclizine (ANTIVERT) 25 MG tablet Take 25 mg by mouth 2 (two) times daily.     . potassium chloride (KLOR-CON) 10 MEQ tablet Take 10 mEq by mouth daily.      Allergies  Allergen Reactions  . Divalproex Sodium Swelling    Leg swell  . Topamax [Topiramate] Other (See Comments)    Lowers blood pressure     Social History   Tobacco Use  . Smoking status: Never Smoker  . Smokeless tobacco: Former Neurosurgeon    Types: Chew  Substance Use Topics  . Alcohol use: No    Alcohol/week: 0.0 standard drinks    Family History  Problem Relation Age of Onset  . Stroke Mother   . Heart disease Father   . Heart attack Father   . Diabetes Father   . Hypertension Brother  Review of Systems  Constitutional: Negative for chills and fever.  Respiratory: Negative for cough and shortness of breath.   Cardiovascular: Negative for chest pain.  Gastrointestinal: Negative for nausea and vomiting.  Musculoskeletal: Positive for arthralgias.    Objective:  Physical Exam Patient is a 74-year-old male.  Well nourished and well developed. General: Alert and oriented x3, cooperative and pleasant, no acute distress. Head: normocephalic, atraumatic, neck supple. Eyes: EOMI. Respiratory: breath sounds clear in all fields, no wheezing, rales, or rhonchi. Cardiovascular: Regular rate and rhythm, no murmurs, gallops or rubs.  Musculoskeletal: Right Knee Exam: No effusion. No Swelling. Range of motion is 5 to 130 degrees. Marked crepitus on range  of motion of the knee. Positive medial joint line tenderness No lateral joint line tenderness. Stable knee.  Calves soft and nontender. Motor function intact in LE. Strength 5/5 LE bilaterally. Neuro: Distal pulses 2+. Sensation to light touch intact in LE.  Vital signs in last 24 hours:    Labs:   Estimated body mass index is 33.05 kg/m as calculated from the following:   Height as of 07/21/18: 5' 11" (1.803 m).   Weight as of 07/21/18: 107.5 kg.   Imaging Review Plain radiographs demonstrate severe degenerative joint disease of the right knee(s). The overall alignment isneutral. The bone quality appears to be adequate for age and reported activity level.    Assessment/Plan:  End stage arthritis, right knee   The patient history, physical examination, clinical judgment of the provider and imaging studies are consistent with end stage degenerative joint disease of the right knee(s) and total knee arthroplasty is deemed medically necessary. The treatment options including medical management, injection therapy arthroscopy and arthroplasty were discussed at length. The risks and benefits of total knee arthroplasty were presented and reviewed. The risks due to aseptic loosening, infection, stiffness, patella tracking problems, thromboembolic complications and other imponderables were discussed. The patient acknowledged the explanation, agreed to proceed with the plan and consent was signed. Patient is being admitted for inpatient treatment for surgery, pain control, PT, OT, prophylactic antibiotics, VTE prophylaxis, progressive ambulation and ADL's and discharge planning. The patient is planning to be discharged home.   Therapy Plans: outpatient therapy at [facility] in [place] Disposition: Home with Wife Planned DVT Prophylaxis: aspirin 325mg BID DME needed: none PCP: Angela Antipin, clearance received TXA: IV Allergies: Topimax - hypertension Anesthesia Concerns: none BMI: 34.9 Not  diabetic.  Other: Oxycodone is okay. - Takes clonazepam PM and diazepam in AM - Has hx of vertigo   Patient's anticipated LOS is less than 2 midnights, meeting these requirements: - Younger than 65 - Lives within 1 hour of care - Has a competent adult at home to recover with post-op recover - NO history of  - Chronic pain requiring opiods  - Diabetes  - Coronary Artery Disease  - Heart failure  - Heart attack  - Stroke  - DVT/VTE  - Cardiac arrhythmia  - Respiratory Failure/COPD  - Renal failure  - Anemia  - Advanced Liver disease   - Patient was instructed on what medications to stop prior to surgery. - Follow-up visit in 2 weeks with Dr. Aluisio - Begin physical therapy following surgery - Pre-operative lab work as pre-surgical testing - Prescriptions will be provided in hospital at time of discharge  Alexxa Sabet, PA-C Orthopedic Surgery [facility] [place] [phone]     

## 2020-06-02 ENCOUNTER — Encounter (HOSPITAL_COMMUNITY): Payer: Self-pay | Admitting: Anesthesiology

## 2020-06-02 LAB — SARS CORONAVIRUS 2 (TAT 6-24 HRS): SARS Coronavirus 2: NEGATIVE

## 2020-06-02 NOTE — Anesthesia Preprocedure Evaluation (Deleted)
Anesthesia Evaluation    Reviewed: Allergy & Precautions, H&P , Patient's Chart, lab work & pertinent test results  Airway        Dental   Pulmonary neg pulmonary ROS,           Cardiovascular hypertension, Pt. on medications      Neuro/Psych Anxiety negative neurological ROS     GI/Hepatic negative GI ROS, Neg liver ROS,   Endo/Other  Hypothyroidism   Renal/GU negative Renal ROS  negative genitourinary   Musculoskeletal  (+) Arthritis , Osteoarthritis,    Abdominal   Peds  Hematology negative hematology ROS (+)   Anesthesia Other Findings   Reproductive/Obstetrics negative OB ROS                            Anesthesia Physical Anesthesia Plan  ASA: II  Anesthesia Plan: Spinal   Post-op Pain Management:    Induction:   PONV Risk Score and Plan: 2 and Propofol infusion, Treatment may vary due to age or medical condition, Ondansetron and TIVA  Airway Management Planned: Nasal Cannula and Simple Face Mask  Additional Equipment: None  Intra-op Plan:   Post-operative Plan:   Informed Consent:   Plan Discussed with:   Anesthesia Plan Comments:        Anesthesia Quick Evaluation

## 2020-06-04 ENCOUNTER — Encounter (HOSPITAL_COMMUNITY): Payer: Self-pay | Admitting: Orthopedic Surgery

## 2020-06-04 MED ORDER — BUPIVACAINE LIPOSOME 1.3 % IJ SUSP
20.0000 mL | Freq: Once | INTRAMUSCULAR | Status: DC
  Filled 2020-06-04: qty 20

## 2020-06-05 ENCOUNTER — Encounter (HOSPITAL_COMMUNITY): Admission: RE | Payer: Self-pay | Source: Home / Self Care

## 2020-06-05 ENCOUNTER — Ambulatory Visit (HOSPITAL_COMMUNITY): Admission: RE | Admit: 2020-06-05 | Payer: Medicare Other | Source: Home / Self Care | Admitting: Orthopedic Surgery

## 2020-06-05 ENCOUNTER — Encounter (HOSPITAL_COMMUNITY): Payer: Self-pay | Admitting: Orthopedic Surgery

## 2020-06-05 SURGERY — ARTHROPLASTY, KNEE, TOTAL
Anesthesia: Choice | Site: Knee | Laterality: Right

## 2020-06-05 MED ORDER — MIDAZOLAM HCL 2 MG/2ML IJ SOLN
INTRAMUSCULAR | Status: AC
  Filled 2020-06-05: qty 2

## 2020-06-05 MED ORDER — FENTANYL CITRATE (PF) 100 MCG/2ML IJ SOLN
INTRAMUSCULAR | Status: AC
  Filled 2020-06-05: qty 2

## 2020-06-05 MED ORDER — PROPOFOL 10 MG/ML IV BOLUS
INTRAVENOUS | Status: AC
  Filled 2020-06-05: qty 40

## 2020-06-05 MED ORDER — PHENYLEPHRINE HCL-NACL 10-0.9 MG/250ML-% IV SOLN
INTRAVENOUS | Status: AC
  Filled 2020-06-05: qty 500

## 2020-06-06 NOTE — Progress Notes (Signed)
Patient's surgery r/s from 06-05-20 to 06-12-20.  Patient phoned and given updated arrival time of 9:00, clear liquids until 8:30, presurgical Ensure to be completed by 8:30 am.  Patient will have Covid test on 1-20 @ 11:00.  Patient stated understanding.

## 2020-06-08 ENCOUNTER — Other Ambulatory Visit (HOSPITAL_COMMUNITY)
Admission: RE | Admit: 2020-06-08 | Discharge: 2020-06-08 | Disposition: A | Discharge status: Home / Self Care | Payer: Medicare Other | Source: Ambulatory Visit | Attending: Orthopedic Surgery | Admitting: Orthopedic Surgery

## 2020-06-08 ENCOUNTER — Inpatient Hospital Stay (HOSPITAL_COMMUNITY): Admission: RE | Admit: 2020-06-08 | Payer: Medicare Other | Source: Ambulatory Visit

## 2020-06-08 DIAGNOSIS — Z01812 Encounter for preprocedural laboratory examination: Secondary | ICD-10-CM | POA: Diagnosis present

## 2020-06-08 DIAGNOSIS — Z20822 Contact with and (suspected) exposure to covid-19: Secondary | ICD-10-CM | POA: Insufficient documentation

## 2020-06-08 LAB — SARS CORONAVIRUS 2 (TAT 6-24 HRS): SARS Coronavirus 2: NEGATIVE

## 2020-06-09 NOTE — Progress Notes (Signed)
Spoke with pt in regards to arriving at [facility] admitting at 0550 on Monday 06/12/2020 for scheduled surgical procedure. Aware no food after midnight; clear liquids from midnight till 0515 consuming entire presurgery drink by 0515 then nothing by mouth.

## 2020-06-12 ENCOUNTER — Ambulatory Visit (HOSPITAL_COMMUNITY): Payer: Medicare Other | Admitting: Anesthesiology

## 2020-06-12 ENCOUNTER — Encounter (HOSPITAL_COMMUNITY)
Admission: RE | Disposition: A | Discharge status: Home / Self Care | Payer: Self-pay | Source: Home / Self Care | Attending: Orthopedic Surgery

## 2020-06-12 ENCOUNTER — Ambulatory Visit (HOSPITAL_COMMUNITY)
Admission: RE | Admit: 2020-06-12 | Discharge: 2020-06-12 | Disposition: A | Discharge status: Home / Self Care | Payer: Medicare Other | Attending: Orthopedic Surgery | Admitting: Orthopedic Surgery

## 2020-06-12 DIAGNOSIS — Z79899 Other long term (current) drug therapy: Secondary | ICD-10-CM | POA: Diagnosis not present

## 2020-06-12 DIAGNOSIS — Z96642 Presence of left artificial hip joint: Secondary | ICD-10-CM | POA: Diagnosis not present

## 2020-06-12 DIAGNOSIS — M1711 Unilateral primary osteoarthritis, right knee: Secondary | ICD-10-CM | POA: Insufficient documentation

## 2020-06-12 DIAGNOSIS — M238X1 Other internal derangements of right knee: Secondary | ICD-10-CM | POA: Diagnosis not present

## 2020-06-12 DIAGNOSIS — Z823 Family history of stroke: Secondary | ICD-10-CM | POA: Insufficient documentation

## 2020-06-12 DIAGNOSIS — Z8249 Family history of ischemic heart disease and other diseases of the circulatory system: Secondary | ICD-10-CM | POA: Diagnosis not present

## 2020-06-12 DIAGNOSIS — M1710 Unilateral primary osteoarthritis, unspecified knee: Secondary | ICD-10-CM | POA: Diagnosis present

## 2020-06-12 DIAGNOSIS — Z888 Allergy status to other drugs, medicaments and biological substances status: Secondary | ICD-10-CM | POA: Diagnosis not present

## 2020-06-12 DIAGNOSIS — M179 Osteoarthritis of knee, unspecified: Secondary | ICD-10-CM | POA: Diagnosis present

## 2020-06-12 DIAGNOSIS — Z833 Family history of diabetes mellitus: Secondary | ICD-10-CM | POA: Insufficient documentation

## 2020-06-12 DIAGNOSIS — Z79890 Hormone replacement therapy (postmenopausal): Secondary | ICD-10-CM | POA: Insufficient documentation

## 2020-06-12 DIAGNOSIS — Z7982 Long term (current) use of aspirin: Secondary | ICD-10-CM | POA: Diagnosis not present

## 2020-06-12 HISTORY — PX: TOTAL KNEE ARTHROPLASTY: SHX125

## 2020-06-12 LAB — TYPE AND SCREEN
ABO/RH(D): O NEG
Antibody Screen: NEGATIVE

## 2020-06-12 LAB — CBC
HCT: 44.4 % (ref 39.0–52.0)
Hemoglobin: 14.7 g/dL (ref 13.0–17.0)
MCH: 29.1 pg (ref 26.0–34.0)
MCHC: 33.1 g/dL (ref 30.0–36.0)
MCV: 87.7 fL (ref 80.0–100.0)
Platelets: 283 10*3/uL (ref 150–400)
RBC: 5.06 MIL/uL (ref 4.22–5.81)
RDW: 13.2 % (ref 11.5–15.5)
WBC: 9.2 10*3/uL (ref 4.0–10.5)
nRBC: 0 % (ref 0.0–0.2)

## 2020-06-12 LAB — SURGICAL PCR SCREEN
MRSA, PCR: NEGATIVE
Staphylococcus aureus: POSITIVE — AB

## 2020-06-12 LAB — BASIC METABOLIC PANEL
Anion gap: 11 (ref 5–15)
BUN: 15 mg/dL (ref 8–23)
CO2: 26 mmol/L (ref 22–32)
Calcium: 9.2 mg/dL (ref 8.9–10.3)
Chloride: 102 mmol/L (ref 98–111)
Creatinine, Ser: 1.31 mg/dL — ABNORMAL HIGH (ref 0.61–1.24)
GFR, Estimated: 58 mL/min — ABNORMAL LOW (ref >60)
Glucose, Bld: 104 mg/dL — ABNORMAL HIGH (ref 70–99)
Potassium: 3.3 mmol/L — ABNORMAL LOW (ref 3.5–5.1)
Sodium: 139 mmol/L (ref 135–145)

## 2020-06-12 SURGERY — ARTHROPLASTY, KNEE, TOTAL
Anesthesia: Monitor Anesthesia Care | Site: Knee | Laterality: Right

## 2020-06-12 MED ORDER — OXYCODONE HCL 5 MG/5ML PO SOLN: 5.0000 mg | Freq: Once | ORAL | Status: AC | PRN

## 2020-06-12 MED ORDER — DEXAMETHASONE SODIUM PHOSPHATE 10 MG/ML IJ SOLN
8.0000 mg | Freq: Once | INTRAMUSCULAR | Status: AC
  Administered 2020-06-12: 8 mg via INTRAVENOUS

## 2020-06-12 MED ORDER — METHOCARBAMOL 500 MG PO TABS: 500.0000 mg | ORAL_TABLET | Freq: Four times a day (QID) | ORAL | 0 refills | Status: DC | PRN

## 2020-06-12 MED ORDER — FENTANYL CITRATE (PF) 100 MCG/2ML IJ SOLN
INTRAMUSCULAR | Status: DC | PRN
  Administered 2020-06-12 (×2): 25 ug via INTRAVENOUS

## 2020-06-12 MED ORDER — LIDOCAINE HCL (CARDIAC) PF 100 MG/5ML IV SOSY
PREFILLED_SYRINGE | INTRAVENOUS | Status: DC | PRN
  Administered 2020-06-12: 30 mg via INTRAVENOUS

## 2020-06-12 MED ORDER — METHOCARBAMOL 500 MG PO TABS: 500.0000 mg | ORAL_TABLET | Freq: Four times a day (QID) | ORAL | 0 refills | Status: AC | PRN

## 2020-06-12 MED ORDER — OXYCODONE HCL 5 MG PO TABS
ORAL_TABLET | ORAL | Status: AC
  Filled 2020-06-12: qty 1

## 2020-06-12 MED ORDER — BUPIVACAINE IN DEXTROSE 0.75-8.25 % IT SOLN
INTRATHECAL | Status: DC | PRN
  Administered 2020-06-12: 2 mL via INTRATHECAL

## 2020-06-12 MED ORDER — LIDOCAINE HCL (PF) 2 % IJ SOLN
INTRAMUSCULAR | Status: AC
  Filled 2020-06-12: qty 5

## 2020-06-12 MED ORDER — LACTATED RINGERS IV BOLUS
500.0000 mL | Freq: Once | INTRAVENOUS | Status: AC
  Administered 2020-06-12: 500 mL via INTRAVENOUS

## 2020-06-12 MED ORDER — MIDAZOLAM HCL 2 MG/2ML IJ SOLN
1.0000 mg | INTRAMUSCULAR | Status: DC
  Administered 2020-06-12: 1 mg via INTRAVENOUS
  Filled 2020-06-12: qty 2

## 2020-06-12 MED ORDER — LACTATED RINGERS IV SOLN: INTRAVENOUS | Status: DC

## 2020-06-12 MED ORDER — ONDANSETRON HCL 4 MG/2ML IJ SOLN
INTRAMUSCULAR | Status: AC
  Filled 2020-06-12: qty 2

## 2020-06-12 MED ORDER — POVIDONE-IODINE 10 % EX SWAB
2.0000 "application " | Freq: Once | CUTANEOUS | Status: AC
  Administered 2020-06-12: 2 via TOPICAL

## 2020-06-12 MED ORDER — PROPOFOL 500 MG/50ML IV EMUL
INTRAVENOUS | Status: DC | PRN
  Administered 2020-06-12: 50 ug/kg/min via INTRAVENOUS

## 2020-06-12 MED ORDER — ASPIRIN EC 325 MG PO TBEC: 325.0000 mg | DELAYED_RELEASE_TABLET | Freq: Every day | ORAL | 0 refills | Status: DC

## 2020-06-12 MED ORDER — ORAL CARE MOUTH RINSE
15.0000 mL | Freq: Once | OROMUCOSAL | Status: AC
  Administered 2020-06-12: 15 mL via OROMUCOSAL

## 2020-06-12 MED ORDER — SODIUM CHLORIDE 0.9 % IR SOLN
Status: DC | PRN
  Administered 2020-06-12: 1000 mL

## 2020-06-12 MED ORDER — MIDAZOLAM HCL 5 MG/5ML IJ SOLN
INTRAMUSCULAR | Status: DC | PRN
  Administered 2020-06-12 (×2): 1 mg via INTRAVENOUS

## 2020-06-12 MED ORDER — OXYCODONE HCL 5 MG PO TABS
5.0000 mg | ORAL_TABLET | Freq: Once | ORAL | Status: AC | PRN
  Administered 2020-06-12: 5 mg via ORAL

## 2020-06-12 MED ORDER — TRAMADOL HCL 50 MG PO TABS: 50.0000 mg | ORAL_TABLET | Freq: Four times a day (QID) | ORAL | 0 refills | Status: AC | PRN

## 2020-06-12 MED ORDER — ASPIRIN EC 325 MG PO TBEC: 325.0000 mg | DELAYED_RELEASE_TABLET | Freq: Two times a day (BID) | ORAL | 0 refills | Status: AC

## 2020-06-12 MED ORDER — CEFAZOLIN SODIUM-DEXTROSE 2-4 GM/100ML-% IV SOLN
INTRAVENOUS | Status: AC
  Filled 2020-06-12: qty 100

## 2020-06-12 MED ORDER — FENTANYL CITRATE (PF) 100 MCG/2ML IJ SOLN
50.0000 ug | INTRAMUSCULAR | Status: DC
  Administered 2020-06-12: 50 ug via INTRAVENOUS
  Filled 2020-06-12: qty 2

## 2020-06-12 MED ORDER — ACETAMINOPHEN 10 MG/ML IV SOLN
1000.0000 mg | Freq: Once | INTRAVENOUS | Status: AC
  Administered 2020-06-12: 1000 mg via INTRAVENOUS
  Filled 2020-06-12: qty 100

## 2020-06-12 MED ORDER — ROPIVACAINE HCL 5 MG/ML IJ SOLN
INTRAMUSCULAR | Status: DC | PRN
  Administered 2020-06-12: 20 mL via PERINEURAL

## 2020-06-12 MED ORDER — SODIUM CHLORIDE (PF) 0.9 % IJ SOLN
INTRAMUSCULAR | Status: DC | PRN
  Administered 2020-06-12: 60 mL

## 2020-06-12 MED ORDER — STERILE WATER FOR IRRIGATION IR SOLN
Status: DC | PRN
  Administered 2020-06-12: 2000 mL

## 2020-06-12 MED ORDER — DEXAMETHASONE SODIUM PHOSPHATE 10 MG/ML IJ SOLN
INTRAMUSCULAR | Status: AC
  Filled 2020-06-12: qty 1

## 2020-06-12 MED ORDER — METHOCARBAMOL 500 MG PO TABS
500.0000 mg | ORAL_TABLET | Freq: Four times a day (QID) | ORAL | Status: DC | PRN
  Administered 2020-06-12: 500 mg via ORAL

## 2020-06-12 MED ORDER — GABAPENTIN 300 MG PO CAPS: ORAL_CAPSULE | ORAL | 0 refills | Status: AC

## 2020-06-12 MED ORDER — ONDANSETRON HCL 4 MG/2ML IJ SOLN: 4.0000 mg | Freq: Four times a day (QID) | INTRAMUSCULAR | Status: AC | PRN

## 2020-06-12 MED ORDER — PROPOFOL 500 MG/50ML IV EMUL
INTRAVENOUS | Status: AC
  Filled 2020-06-12: qty 50

## 2020-06-12 MED ORDER — CEFAZOLIN SODIUM-DEXTROSE 2-4 GM/100ML-% IV SOLN
2.0000 g | Freq: Four times a day (QID) | INTRAVENOUS | Status: DC
  Administered 2020-06-12: 2 g via INTRAVENOUS

## 2020-06-12 MED ORDER — MIDAZOLAM HCL 2 MG/2ML IJ SOLN
INTRAMUSCULAR | Status: AC
  Filled 2020-06-12: qty 2

## 2020-06-12 MED ORDER — FENTANYL CITRATE (PF) 100 MCG/2ML IJ SOLN
INTRAMUSCULAR | Status: AC
  Filled 2020-06-12: qty 2

## 2020-06-12 MED ORDER — BUPIVACAINE LIPOSOME 1.3 % IJ SUSP
20.0000 mL | Freq: Once | INTRAMUSCULAR | Status: AC
  Administered 2020-06-12: 20 mL
  Filled 2020-06-12: qty 20

## 2020-06-12 MED ORDER — OXYCODONE HCL 5 MG PO TABS: 5.0000 mg | ORAL_TABLET | Freq: Four times a day (QID) | ORAL | 0 refills | Status: AC | PRN

## 2020-06-12 MED ORDER — LACTATED RINGERS IV BOLUS
250.0000 mL | Freq: Once | INTRAVENOUS | Status: AC
  Administered 2020-06-12: 250 mL via INTRAVENOUS

## 2020-06-12 MED ORDER — FENTANYL CITRATE (PF) 100 MCG/2ML IJ SOLN: 25.0000 ug | INTRAMUSCULAR | Status: DC | PRN

## 2020-06-12 MED ORDER — ONDANSETRON HCL 4 MG/2ML IJ SOLN
INTRAMUSCULAR | Status: DC | PRN
  Administered 2020-06-12: 4 mg via INTRAVENOUS

## 2020-06-12 MED ORDER — ONDANSETRON HCL 4 MG/2ML IJ SOLN
INTRAMUSCULAR | Status: AC
  Administered 2020-06-12: 4 mg via INTRAVENOUS
  Filled 2020-06-12: qty 2

## 2020-06-12 MED ORDER — GABAPENTIN 300 MG PO CAPS: ORAL_CAPSULE | ORAL | 0 refills | Status: DC

## 2020-06-12 MED ORDER — TRANEXAMIC ACID-NACL 1000-0.7 MG/100ML-% IV SOLN
1000.0000 mg | INTRAVENOUS | Status: AC
  Administered 2020-06-12: 1000 mg via INTRAVENOUS
  Filled 2020-06-12: qty 100

## 2020-06-12 MED ORDER — METHOCARBAMOL 500 MG PO TABS
ORAL_TABLET | ORAL | Status: AC
  Filled 2020-06-12: qty 1

## 2020-06-12 MED ORDER — TRAMADOL HCL 50 MG PO TABS: 50.0000 mg | ORAL_TABLET | Freq: Four times a day (QID) | ORAL | 0 refills | Status: DC | PRN

## 2020-06-12 MED ORDER — CHLORHEXIDINE GLUCONATE 0.12 % MT SOLN: 15.0000 mL | Freq: Once | OROMUCOSAL | Status: AC

## 2020-06-12 MED ORDER — METHOCARBAMOL 500 MG IVPB - SIMPLE MED: 500.0000 mg | Freq: Four times a day (QID) | INTRAVENOUS | Status: DC | PRN

## 2020-06-12 MED ORDER — CEFAZOLIN SODIUM-DEXTROSE 2-4 GM/100ML-% IV SOLN
2.0000 g | INTRAVENOUS | Status: AC
  Administered 2020-06-12: 2 g via INTRAVENOUS
  Filled 2020-06-12: qty 100

## 2020-06-12 SURGICAL SUPPLY — 55 items
ATTUNE MED DOME PAT 41 KNEE (Knees) ×2 IMPLANT
ATTUNE PS FEM RT SZ 8 CEM KNEE (Femur) ×2 IMPLANT
ATTUNE PSRP INSR SZ8 10 KNEE (Insert) ×2 IMPLANT
BAG ZIPLOCK 12X15 (MISCELLANEOUS) ×2 IMPLANT
BASE TIBIAL ROT PLAT SZ 8 KNEE (Knees) ×1 IMPLANT
BLADE SAG 18X100X1.27 (BLADE) ×2 IMPLANT
BLADE SAW SGTL 11.0X1.19X90.0M (BLADE) ×2 IMPLANT
BNDG ELASTIC 6X5.8 VLCR STR LF (GAUZE/BANDAGES/DRESSINGS) ×2 IMPLANT
BOWL SMART MIX CTS (DISPOSABLE) ×2 IMPLANT
CEMENT HV SMART SET (Cement) ×4 IMPLANT
COVER SURGICAL LIGHT HANDLE (MISCELLANEOUS) ×2 IMPLANT
COVER WAND RF STERILE (DRAPES) IMPLANT
CUFF TOURN SGL QUICK 34 (TOURNIQUET CUFF) ×1
CUFF TRNQT CYL 34X4.125X (TOURNIQUET CUFF) ×1 IMPLANT
DECANTER SPIKE VIAL GLASS SM (MISCELLANEOUS) IMPLANT
DRAPE U-SHAPE 47X51 STRL (DRAPES) ×2 IMPLANT
DRESSING AQUACEL AG SP 3.5X10 (GAUZE/BANDAGES/DRESSINGS) ×1 IMPLANT
DRSG AQUACEL AG ADV 3.5X10 (GAUZE/BANDAGES/DRESSINGS) ×2 IMPLANT
DRSG AQUACEL AG SP 3.5X10 (GAUZE/BANDAGES/DRESSINGS) ×2
DURAPREP 26ML APPLICATOR (WOUND CARE) ×2 IMPLANT
ELECT REM PT RETURN 15FT ADLT (MISCELLANEOUS) ×2 IMPLANT
GLOVE BIO SURGEON STRL SZ8 (GLOVE) ×2 IMPLANT
GLOVE BIOGEL PI IND STRL 8.5 (GLOVE) ×1 IMPLANT
GLOVE BIOGEL PI INDICATOR 8.5 (GLOVE) ×1
GLOVE SRG 8 PF TXTR STRL LF DI (GLOVE) ×1 IMPLANT
GLOVE SURG ENC MOIS LTX SZ6 (GLOVE) IMPLANT
GLOVE SURG ENC MOIS LTX SZ7 (GLOVE) ×2 IMPLANT
GLOVE SURG UNDER POLY LF SZ6.5 (GLOVE) ×2 IMPLANT
GLOVE SURG UNDER POLY LF SZ8 (GLOVE) ×1
GOWN STRL REUS W/TWL LRG LVL3 (GOWN DISPOSABLE) ×4 IMPLANT
GOWN STRL REUS W/TWL XL LVL3 (GOWN DISPOSABLE) ×2 IMPLANT
HANDPIECE INTERPULSE COAX TIP (DISPOSABLE) ×1
HOLDER FOLEY CATH W/STRAP (MISCELLANEOUS) IMPLANT
IMMOBILIZER KNEE 20 (SOFTGOODS) ×2
IMMOBILIZER KNEE 20 THIGH 36 (SOFTGOODS) ×1 IMPLANT
KIT TURNOVER KIT A (KITS) ×2 IMPLANT
MANIFOLD NEPTUNE II (INSTRUMENTS) ×2 IMPLANT
NS IRRIG 1000ML POUR BTL (IV SOLUTION) ×2 IMPLANT
PACK TOTAL KNEE CUSTOM (KITS) ×2 IMPLANT
PADDING CAST COTTON 6X4 STRL (CAST SUPPLIES) ×2 IMPLANT
PENCIL SMOKE EVACUATOR (MISCELLANEOUS) ×2 IMPLANT
PIN DRILL FIX HALF THREAD (BIT) ×2 IMPLANT
PIN STEINMAN FIXATION KNEE (PIN) ×2 IMPLANT
PROTECTOR NERVE ULNAR (MISCELLANEOUS) ×2 IMPLANT
SET HNDPC FAN SPRY TIP SCT (DISPOSABLE) ×1 IMPLANT
STRIP CLOSURE SKIN 1/2X4 (GAUZE/BANDAGES/DRESSINGS) ×2 IMPLANT
SUT MNCRL AB 4-0 PS2 18 (SUTURE) ×2 IMPLANT
SUT STRATAFIX 0 PDS 27 VIOLET (SUTURE) ×2
SUT VIC AB 2-0 CT1 27 (SUTURE) ×3
SUT VIC AB 2-0 CT1 TAPERPNT 27 (SUTURE) ×3 IMPLANT
SUTURE STRATFX 0 PDS 27 VIOLET (SUTURE) ×1 IMPLANT
TIBIAL BASE ROT PLAT SZ 8 KNEE (Knees) ×2 IMPLANT
TRAY FOLEY MTR SLVR 16FR STAT (SET/KITS/TRAYS/PACK) ×2 IMPLANT
WATER STERILE IRR 1000ML POUR (IV SOLUTION) ×4 IMPLANT
WRAP KNEE MAXI GEL POST OP (GAUZE/BANDAGES/DRESSINGS) ×2 IMPLANT

## 2020-06-12 NOTE — Anesthesia Preprocedure Evaluation (Signed)
Anesthesia Evaluation  Patient identified by MRN, date of birth, ID band Patient awake    Reviewed: Allergy & Precautions, H&P , NPO status , Patient's Chart, lab work & pertinent test results  Airway Mallampati: II   Neck ROM: full    Dental   Pulmonary neg pulmonary ROS,    breath sounds clear to auscultation       Cardiovascular hypertension,  Rhythm:regular Rate:Normal     Neuro/Psych    GI/Hepatic   Endo/Other  Hypothyroidism   Renal/GU stones     Musculoskeletal  (+) Arthritis ,   Abdominal   Peds  Hematology   Anesthesia Other Findings   Reproductive/Obstetrics                             Anesthesia Physical Anesthesia Plan  ASA: II  Anesthesia Plan: Spinal and MAC   Post-op Pain Management:  Regional for Post-op pain   Induction: Intravenous  PONV Risk Score and Plan: 1 and Ondansetron, Dexamethasone, Propofol infusion and Treatment may vary due to age or medical condition  Airway Management Planned: Simple Face Mask  Additional Equipment:   Intra-op Plan:   Post-operative Plan:   Informed Consent: I have reviewed the patients History and Physical, chart, labs and discussed the procedure including the risks, benefits and alternatives for the proposed anesthesia with the patient or authorized representative who has indicated his/her understanding and acceptance.     Dental advisory given  Plan Discussed with: CRNA, Anesthesiologist and Surgeon  Anesthesia Plan Comments:         Anesthesia Quick Evaluation

## 2020-06-12 NOTE — Anesthesia Postprocedure Evaluation (Signed)
Anesthesia Post Note  Patient: Douglas Brewer.  Procedure(s) Performed: TOTAL KNEE ARTHROPLASTY (Right Knee)     Patient location during evaluation: PACU Anesthesia Type: MAC and Spinal Level of consciousness: oriented and awake and alert Pain management: pain level controlled Vital Signs Assessment: post-procedure vital signs reviewed and stable Respiratory status: spontaneous breathing, respiratory function stable and patient connected to nasal cannula oxygen Cardiovascular status: blood pressure returned to baseline and stable Postop Assessment: no headache, no backache and no apparent nausea or vomiting Anesthetic complications: no   No complications documented.  Last Vitals:  Vitals:   06/12/20 1200 06/12/20 1300  BP: (!) 139/91 (!) 141/91  Pulse: 65 72  Resp: 16   Temp:    SpO2: 96% 98%    Last Pain:  Vitals:   06/12/20 1200  TempSrc:   PainSc: 0-No pain                 Omer Monter S

## 2020-06-12 NOTE — Evaluation (Addendum)
Physical Therapy Evaluation Patient Details Name: Douglas Brewer. MRN: [id number] DOB: 10/09/1945 Today's Date: 06/12/2020   History of Present Illness  Patient is a 75yo male s/p R TKA on 06/12/2020 with PMH significant for vertigo, PVD, hypothyroidism, HTN, anxiety, and L THA in 2020.  Clinical Impression  Pt is a 75yo male s/p R TKA POD 0. Pt reports that he is modified independent at baseline with intermittent use of RW. Pt required MIN guard and verbal cues for sit to stand transfers. Pt required MIN guard for ambulation 140' with verbal cues for RW management and step to gait pattern with no LOB or knee buckling. Pt was able to safely perform stair negotiation with 1 rail and SPC with MIN assist for safety and RW management and cues for sequencing. Pt c/o  feeling lightheaded and nauseous, pt was found to be orthostatic with BP in 140s/90s at start of session dropping to 100's/60s following mobility. PT provided MIN assist for stand step from wheelchair to recliner for safety after symptoms improved following seated rest. Pt's wife will be available to assist at home and pt's son lives nearby. Pt will benefit from skilled PT to increase independence and safety with mobility. Acute PT to follow up with pt today to reassess tolerance to mobility.      Follow Up Recommendations Follow surgeon's recommendation for DC plan and follow-up therapies;Outpatient PT    Equipment Recommendations  None recommended by PT    Recommendations for Other Services       Precautions / Restrictions Precautions Precautions: Fall Restrictions Weight Bearing Restrictions: No      Mobility  Bed Mobility Overal bed mobility: Needs Assistance Bed Mobility: Supine to Sit     Supine to sit: Supervision     General bed mobility comments: pt OOB in recliner    Transfers Overall transfer level: Needs assistance Equipment used: Rolling walker (2 wheeled) Transfers: Sit to/from Stand Sit to Stand:  Min guard;Supervision Stand pivot transfers: Min assist       General transfer comment: Pt performed sit to stand with MIN guard/supervision for safety and cues for hand placement.  Ambulation/Gait Ambulation/Gait assistance: Min guard;Supervision Gait Distance (Feet): 120 Feet Assistive device: Rolling walker (2 wheeled) Gait Pattern/deviations: Step-to pattern;Decreased weight shift to right;Decreased stride length Gait velocity: decr   General Gait Details: pt ambulated with MIN guard-supervision for safety and cues to maintain proximimity to RW with no LOB or knee buckling. Pt denied lightheadedness.  Stairs Stairs: Yes Stairs assistance: Min assist Stair Management: One rail Right;Forwards;With cane Number of Stairs: 3 General stair comments: pt safely negotiated stairs with MIN assist for safety and cues for sequencing. PT provided education on safe guarding position for family members when assisting at home.  Wheelchair Mobility    Modified Rankin (Stroke Patients Only)       Balance Overall balance assessment: Needs assistance Sitting-balance support: Feet supported Sitting balance-Leahy Scale: Good     Standing balance support: Bilateral upper extremity supported Standing balance-Leahy Scale: Poor Standing balance comment: use of RW to maintain standing balance                             Pertinent Vitals/Pain Pain Assessment: 0-10 Pain Score: 4  Pain Location: R knee Pain Descriptors / Indicators: Sore;Tender;Discomfort (stiff) Pain Intervention(s): Monitored during session;Limited activity within patient's tolerance;Repositioned    Home Living Family/patient expects to be discharged to:: Private residence Living Arrangements:  Spouse/significant other Available Help at Discharge: Family Type of Home: House Home Access: Stairs to enter Entrance Stairs-Rails: Right Entrance Stairs-Number of Steps: 4 Home Layout: One level Home Equipment:  Walker - 2 wheels;Cane - single point Additional Comments: pt's wife will be available to assist at home and his son lives down the street    Prior Function Level of Independence: Independent with assistive device(s)         Comments: pt with intermittent use of RW at baseline     Hand Dominance   Dominant Hand: Right    Extremity/Trunk Assessment   Upper Extremity Assessment Upper Extremity Assessment: Overall WFL for tasks assessed    Lower Extremity Assessment Lower Extremity Assessment: RLE deficits/detail RLE Deficits / Details: Pt with 4/5 quad set and able to perform full SLR against gravity with no extensor lag observed. RLE Sensation: WNL RLE Coordination: WNL    Cervical / Trunk Assessment Cervical / Trunk Assessment: Normal  Communication   Communication: HOH  Cognition Arousal/Alertness: Awake/alert Behavior During Therapy: WFL for tasks assessed/performed Overall Cognitive Status: Within Functional Limits for tasks assessed                                        General Comments      Exercises Total Joint Exercises Ankle Circles/Pumps: AROM;Both;10 reps;Seated Quad Sets: AROM;Right;5 reps;Seated Short Arc Quad: AROM;Right;5 reps;Seated Heel Slides: AROM;5 reps;Right;Seated Hip ABduction/ADduction: AROM;Right;5 reps;Seated Straight Leg Raises: AROM;5 reps;Right;Seated Long Arc Quad: AROM;Right;5 reps;Seated   Assessment/Plan    PT Assessment Patient needs continued PT services  PT Problem List Decreased strength;Decreased range of motion;Decreased activity tolerance;Decreased balance;Decreased mobility;Decreased coordination;Decreased knowledge of use of DME;Pain       PT Treatment Interventions DME instruction;Gait training;Stair training;Functional mobility training;Balance training;Therapeutic exercise;Therapeutic activities;Patient/family education    PT Goals (Current goals can be found in the Care Plan section)  Acute  Rehab PT Goals Patient Stated Goal: pt would like to get back to hunting and fishing PT Goal Formulation: With patient Time For Goal Achievement: 06/19/20 Potential to Achieve Goals: Good    Frequency 7X/week   Barriers to discharge        Co-evaluation               AM-PAC PT "6 Clicks" Mobility  Outcome Measure Help needed turning from your back to your side while in a flat bed without using bedrails?: A Little Help needed moving from lying on your back to sitting on the side of a flat bed without using bedrails?: A Little Help needed moving to and from a bed to a chair (including a wheelchair)?: A Little Help needed standing up from a chair using your arms (e.g., wheelchair or bedside chair)?: A Little Help needed to walk in hospital room?: A Little Help needed climbing 3-5 steps with a railing? : A Little 6 Click Score: 18    End of Session Equipment Utilized During Treatment: Gait belt Activity Tolerance: Patient tolerated treatment well Patient left: in chair;with call bell/phone within reach Nurse Communication: Mobility status PT Visit Diagnosis: Unsteadiness on feet (R26.81);Muscle weakness (generalized) (M62.81);Pain Pain - Right/Left: Right Pain - part of body: Knee      Loyal Gambler, SPT  Acute rehab    Kobi Mario 06/12/2020, 4:18 PM

## 2020-06-12 NOTE — Interval H&P Note (Signed)
History and Physical Interval Note:  06/12/2020 6:34 AM  Douglas Brewer.  has presented today for surgery, with the diagnosis of right knee osteoarthritis.  The various methods of treatment have been discussed with the patient and family. After consideration of risks, benefits and other options for treatment, the patient has consented to  Procedure(s) with comments: TOTAL KNEE ARTHROPLASTY (Right) - as a surgical intervention.  The patient's history has been reviewed, patient examined, no change in status, stable for surgery.  I have reviewed the patient's chart and labs.  Questions were answered to the patient's satisfaction.     Homero Fellers Lydell Moga

## 2020-06-12 NOTE — Progress Notes (Signed)
Physical Therapy Treatment Patient Details Name: Douglas Brewer. MRN: [id number] DOB: Jul 01, 1945 Today's Date: 06/12/2020    History of Present Illness Patient is a 75yo male s/p R TKA on 06/12/2020 with PMH significant for vertigo, PVD, hypothyroidism, HTN, anxiety, and L THA in 2020.    PT Comments    Pt progressing toward acute PT goals, ambulating with MIN guard progressing to supervision for safety and cues to maintain proximity to RW.  Pt able to verbalize safe guarding position for family when assisting with stairs. Pt denied symptoms throughout mobility this session with BP at start of session 143/94 decreasing to 118/15mmHG after gait. PT educated pt on safety measures for discharge home, encouraged to put chairs at home entrance and around the house for seated rest breaks as needed. Educated on symptoms of hypotension and important of alerting family if he feels symptomatic. Pt is at safe mobility level for return home and will have assistance from his wife and son.  Acute therapy to follow up during stay.       Follow Up Recommendations  Follow surgeon's recommendation for DC plan and follow-up therapies;Outpatient PT     Equipment Recommendations  None recommended by PT    Recommendations for Other Services       Precautions / Restrictions Precautions Precautions: Fall Restrictions Weight Bearing Restrictions: No    Mobility  Bed Mobility Overal bed mobility: Needs Assistance Bed Mobility: Supine to Sit     Supine to sit: Supervision     General bed mobility comments: pt OOB in recliner  Transfers Overall transfer level: Needs assistance Equipment used: Rolling walker (2 wheeled) Transfers: Sit to/from Stand Sit to Stand: Min guard;Supervision Stand pivot transfers: Min assist       General transfer comment: Pt performed sit to stand with MIN guard/supervision for safety and cues for hand placement.  Ambulation/Gait Ambulation/Gait assistance: Min  guard;Supervision Gait Distance (Feet): 120 Feet Assistive device: Rolling walker (2 wheeled) Gait Pattern/deviations: Step-to pattern;Decreased weight shift to right;Decreased stride length Gait velocity: decr   General Gait Details: pt ambulated with MIN guard-supervision for safety and cues to maintain proximimity to RW with no LOB or knee buckling. Pt denied lightheadedness.   Stairs Stairs: Yes Stairs assistance: Min assist Stair Management: One rail Right;Forwards;With cane Number of Stairs: 3 General stair comments: pt safely negotiated stairs with MIN assist for safety and cues for sequencing. PT provided education on safe guarding position for family members when assisting at home.   Wheelchair Mobility    Modified Rankin (Stroke Patients Only)       Balance Overall balance assessment: Needs assistance Sitting-balance support: Feet supported Sitting balance-Leahy Scale: Good     Standing balance support: Bilateral upper extremity supported Standing balance-Leahy Scale: Poor Standing balance comment: use of RW to maintain standing balance                            Cognition Arousal/Alertness: Awake/alert Behavior During Therapy: WFL for tasks assessed/performed Overall Cognitive Status: Within Functional Limits for tasks assessed                                        Exercises      General Comments        Pertinent Vitals/Pain Pain Assessment: 0-10 Pain Score: 4  Pain Location: R knee Pain Descriptors /  Indicators: Sore;Tender;Discomfort (stiff) Pain Intervention(s): Monitored during session;Limited activity within patient's tolerance;Repositioned    Home Living Family/patient expects to be discharged to:: Private residence Living Arrangements: Spouse/significant other Available Help at Discharge: Family Type of Home: House Home Access: Stairs to enter Entrance Stairs-Rails: Right Home Layout: One level Home  Equipment: Environmental consultant - 2 wheels;Cane - single point Additional Comments: pt's wife will be available to assist at home and his son lives down the street    Prior Function Level of Independence: Independent with assistive device(s)      Comments: pt with intermittent use of RW at baseline   PT Goals (current goals can now be found in the care plan section) Acute Rehab PT Goals Patient Stated Goal: pt would like to get back to hunting and fishing PT Goal Formulation: With patient Time For Goal Achievement: 06/19/20 Potential to Achieve Goals: Good Progress towards PT goals: Progressing toward goals    Frequency    7X/week      PT Plan Current plan remains appropriate    Co-evaluation              AM-PAC PT "6 Clicks" Mobility   Outcome Measure  Help needed turning from your back to your side while in a flat bed without using bedrails?: A Little Help needed moving from lying on your back to sitting on the side of a flat bed without using bedrails?: A Little Help needed moving to and from a bed to a chair (including a wheelchair)?: A Little Help needed standing up from a chair using your arms (e.g., wheelchair or bedside chair)?: A Little Help needed to walk in hospital room?: A Little Help needed climbing 3-5 steps with a railing? : A Little 6 Click Score: 18    End of Session Equipment Utilized During Treatment: Gait belt Activity Tolerance: Patient tolerated treatment well Patient left: in chair;with call bell/phone within reach Nurse Communication: Mobility status PT Visit Diagnosis: Unsteadiness on feet (R26.81);Muscle weakness (generalized) (M62.81);Pain Pain - Right/Left: Right Pain - part of body: Knee     Time: 8280-0349 PT Time Calculation (min) (ACUTE ONLY): 24 min  Charges:  $Gait Training: 8-22 mins $Therapeutic Exercise: 8-22 mins                     Lauren Youngblood, SPT  Acute rehab     Lauren Youngblood 06/12/2020, 4:15 PM

## 2020-06-12 NOTE — Discharge Instructions (Signed)
 Frank Aluisio, MD Total Joint Specialist [facility] [address] [place], [place] [address] [phone]    TOTAL KNEE REPLACEMENT POSTOPERATIVE DIRECTIONS    Knee Rehabilitation, Guidelines Following Surgery  Results after knee surgery are often greatly improved when you follow the exercise, range of motion and muscle strengthening exercises prescribed by your doctor. Safety measures are also important to protect the knee from further injury. If any of these exercises cause you to have increased pain or swelling in your knee joint, decrease the amount until you are comfortable again and slowly increase them. If you have problems or questions, call your caregiver or physical therapist for advice.   BLOOD CLOT PREVENTION . Take a 325 mg Aspirin two times a day for three weeks following surgery. Then resume one 81 mg Aspirin once a day. . You may resume your vitamins/supplements upon discharge from the hospital. . Do not take any NSAIDs (Advil, Aleve, Ibuprofen, Meloxicam, etc.) until you have discontinued the 325 mg Aspirin.   HOME CARE INSTRUCTIONS  . Remove items at home which could result in a fall. This includes throw rugs or furniture in walking pathways.   ICE to the affected knee as much as tolerated. Icing helps control swelling. If the swelling is well controlled you will be more comfortable and rehab easier. Continue to use ice on the knee for pain and swelling from surgery. You may notice swelling that will progress down to the foot and ankle. This is normal after surgery. Elevate the leg when you are not up walking on it.    Continue to use the breathing machine which will help keep your temperature down.  It is common for your temperature to cycle up and down following surgery, especially at night when you are not up moving around and exerting yourself.  The breathing machine keeps your lungs expanded and your temperature down.  Do not place  pillow under knee, focus on keeping the knee straight while resting  PAIN CONTROL Achieving adequate pain control can be challenging in the first 48-72 hours after the nerve block wears off. During this time it is best to stay ahead of the pain by taking your prescribed pain meds every 4-6 hours. Once the pain is well controlled (you will not be pain free but the goal is having it under control) you may begin slowly weaning off the medications  DIET You may resume your previous home diet once you are discharged from the hospital.  DRESSING / WOUND CARE / SHOWERING . Keep your bulky bandage on for 2 days. On the third post-operative day you may remove the Ace bandage and gauze. There is a waterproof adhesive bandage on your skin which will stay in place until your first follow-up appointment. Once you remove this you will not need to place another bandage . You may begin showering 3 days following surgery, but do not submerge the incision under water.  ACTIVITY For the first 5 days the key is rest and control of pain and swelling . You should rest, ice and elevate the leg for 50 minutes out of every hour. Get up and walk/stretch for 10 minutes per hour. After 5 days you can increase your activity slowly as tolerated . Walk with your walker as instructed. Use the walker until you are comfortable transitioning to a cane. Walk with the cane in the opposite hand of the operative leg. You may discontinue the cane once you are comfortable. . You   discontinue the knee immobilizer once you are able to perform a straight leg raise while lying down  Avoid periods of inactivity such as sitting longer than an hour when not asleep. This helps prevent blood clots.   Do your home exercises twice a day starting on post-operative day 3. On the days you go to physical therapy, just do the home exercises once that day.  Do not drive a car until released by your surgeon.   Do not drive while taking  narcotics.  TED HOSE STOCKINGS Wear the elastic stockings on both legs for three weeks following surgery during the day. You may remove them at night for sleeping.  WEIGHT BEARING You may bear weight as tolerated on the operative leg.  POSTOPERATIVE CONSTIPATION PROTOCOL Constipation - defined medically as fewer than three stools per week and severe constipation as less than one stool per week.  One of the most common issues patients have following surgery is constipation.  Even if you have a regular bowel pattern at home, your normal regimen is likely to be disrupted due to multiple reasons following surgery.  Combination of anesthesia, postoperative narcotics, change in appetite and fluid intake all can affect your bowels.  In order to avoid complications following surgery, here are some recommendations in order to help you during your recovery period.  Colace (docusate) - Pick up an over-the-counter form of Colace or another stool softener and take twice a day as long as you are requiring postoperative pain medications.  Take with a full glass of water daily.  If you experience loose stools or diarrhea, hold the colace until you stool forms back up.  If your symptoms do not get better within 1 week or if they get worse, check with your doctor.  MiraLax (polyethylene glycol) - Pick up over-the-counter to have on hand.  MiraLax is a solution that will increase the amount of water in your bowels to assist with bowel movements.  Take as directed and can mix with a glass of water, juice, soda, coffee, or tea.  Take if you go more than two days without a movement. Do not use MiraLax more than once per day. Call your doctor if you are still constipated or irregular after using this medication for 7 days in a row.  If you continue to have problems with postoperative constipation, please contact the office for further assistance and recommendations.  If you experience "the worst abdominal pain ever" or  develop nausea or vomiting, please contact the office immediatly for further recommendations for treatment.  ITCHING  If you experience itching with your medications, try taking only a single pain pill, or even half a pain pill at a time.  You can also use Benadryl over the counter for itching or also to help with sleep.   MEDICATIONS See your medication summary on the After Visit Summary that the nursing staff will review with you prior to discharge.  You may have some home medications which will be placed on hold until you complete the course of blood thinner medication.  It is important for you to complete the blood thinner medication as prescribed by your surgeon.  Continue your approved medications as instructed at time of discharge.  PRECAUTIONS If you experience chest pain or shortness of breath - call 911 immediately for transfer to the hospital emergency department.  If you develop a fever greater that 101 F, purulent drainage from wound, increased redness or drainage from wound, foul odor from the wound/dressing,  wound/dressing, or calf pain - CONTACT YOUR SURGEON.                                                   FOLLOW-UP APPOINTMENTS Make sure you keep all of your appointments after your operation with your surgeon and caregivers. You should call the office at the above phone number and make an appointment for approximately two weeks after the date of your surgery or on the date instructed by your surgeon outlined in the "After Visit Summary".  MAKE SURE YOU:  . Understand these instructions.  . Get help right away if you are not doing well or get worse.   DENTAL ANTIBIOTICS:  In most cases prophylactic antibiotics for Dental procdeures after total joint surgery are not necessary.  Exceptions are as follows:  1. History of prior total joint infection  2. Severely immunocompromised (Organ Transplant, cancer chemotherapy, Rheumatoid biologic meds such as Humera)  3. Poorly controlled  diabetes (A1C &gt; 8.0, blood glucose over 200)  If you have one of these conditions, contact your surgeon for an antibiotic prescription, prior to your dental procedure.    Pick up stool softner and laxative for home use following surgery while on pain medications. May shower starting three days after surgery. Please use a clean towel to pat the incision dry following showers. Continue to use ice for pain and swelling after surgery. Do not use any lotions or creams on the incision until instructed by your surgeon.  

## 2020-06-12 NOTE — Transfer of Care (Signed)
Immediate Anesthesia Transfer of Care Note  Patient: Douglas Brewer.  Procedure(s) Performed: TOTAL KNEE ARTHROPLASTY (Right Knee)  Patient Location: PACU  Anesthesia Type:Spinal  Level of Consciousness: awake, alert , oriented and patient cooperative  Airway & Oxygen Therapy: Patient Spontanous Breathing and Patient connected to face mask oxygen  Post-op Assessment: Report given to RN and Post -op Vital signs reviewed and stable  Post vital signs: Reviewed and stable  Last Vitals:  Vitals Value Taken Time  BP 128/82 06/12/20 1008  Temp    Pulse 62 06/12/20 1010  Resp 15 06/12/20 1010  SpO2 100 % 06/12/20 1010  Vitals shown include unvalidated device data.  Last Pain:  Vitals:   06/12/20 0623  TempSrc: Oral         Complications: No complications documented.

## 2020-06-12 NOTE — Op Note (Signed)
OPERATIVE REPORT-TOTAL KNEE ARTHROPLASTY   Pre-operative diagnosis- Osteoarthritis  Right knee(s)  Post-operative diagnosis- Osteoarthritis Right knee(s)  Procedure-  Right  Total Knee Arthroplasty  Surgeon- Gus Rankin. Catheleen Langhorne, MD  Assistant- Arther Abbott, PA-C   Anesthesia-  Adductor canal block and spinal  EBL-20 mL   Drains None  Tourniquet time- 47 minutes @ 300 mm Hg  Complications- None  Condition-PACU - hemodynamically stable.   Brief Clinical Note  Douglas Brewer. is a 75 y.o. year old male with end stage OA of his right knee with progressively worsening pain and dysfunction. He has constant pain, with activity and at rest and significant functional deficits with difficulties even with ADLs. He has had extensive non-op management including analgesics, injections of cortisone and viscosupplements, and home exercise program, but remains in significant pain with significant dysfunction. Radiographs show bone on bone arthritis medial and patellofemoral. He presents now for right Total Knee Arthroplasty.    Procedure in detail---   The patient is brought into the operating room and positioned supine on the operating table. After successful administration of  Adductor canal block and spinal,   a tourniquet is placed high on the  Right thigh(s) and the lower extremity is prepped and draped in the usual sterile fashion. Time out is performed by the operating team and then the  Right lower extremity is wrapped in Esmarch, knee flexed and the tourniquet inflated to 300 mmHg.       A midline incision is made with a ten blade through the subcutaneous tissue to the level of the extensor mechanism. A fresh blade is used to make a medial parapatellar arthrotomy. Soft tissue over the proximal medial tibia is subperiosteally elevated to the joint line with a knife and into the semimembranosus bursa with a Cobb elevator. Soft tissue over the proximal lateral tibia is elevated with  attention being paid to avoiding the patellar tendon on the tibial tubercle. The patella is everted, knee flexed 90 degrees and the ACL and PCL are removed. Findings are bone on bone medial and patellofemoral with massive global osteophytes.        The drill is used to create a starting hole in the distal femur and the canal is thoroughly irrigated with sterile saline to remove the fatty contents. The 5 degree Right  valgus alignment guide is placed into the femoral canal and the distal femoral cutting block is pinned to remove 9 mm off the distal femur. Resection is made with an oscillating saw.      The tibia is subluxed forward and the menisci are removed. The extramedullary alignment guide is placed referencing proximally at the medial aspect of the tibial tubercle and distally along the second metatarsal axis and tibial crest. The block is pinned to remove 35mm off the more deficient medial  side. Resection is made with an oscillating saw. Size 8is the most appropriate size for the tibia and the proximal tibia is prepared with the modular drill and keel punch for that size.      The femoral sizing guide is placed and size 8 is most appropriate. Rotation is marked off the epicondylar axis and confirmed by creating a rectangular flexion gap at 90 degrees. The size 8 cutting block is pinned in this rotation and the anterior, posterior and chamfer cuts are made with the oscillating saw. The intercondylar block is then placed and that cut is made.      Trial size 8 tibial component, trial size 8  posterior stabilized femur and a 10  mm posterior stabilized rotating platform insert trial is placed. Full extension is achieved with excellent varus/valgus and anterior/posterior balance throughout full range of motion. The patella is everted and thickness measured to be 27  mm. Free hand resection is taken to 15 mm, a 41 template is placed, lug holes are drilled, trial patella is placed, and it tracks normally.  Osteophytes are removed off the posterior femur with the trial in place. All trials are removed and the cut bone surfaces prepared with pulsatile lavage. Cement is mixed and once ready for implantation, the size 8 tibial implant, size  8 posterior stabilized femoral component, and the size 41 patella are cemented in place and the patella is held with the clamp. The trial insert is placed and the knee held in full extension. The Exparel (20 ml mixed with 60 ml saline) is injected into the extensor mechanism, posterior capsule, medial and lateral gutters and subcutaneous tissues.  All extruded cement is removed and once the cement is hard the permanent 10 mm posterior stabilized rotating platform insert is placed into the tibial tray.      The wound is copiously irrigated with saline solution and the extensor mechanism closed with # 0 Stratofix suture. The tourniquet is released for a total tourniquet time of 47  minutes. Flexion against gravity is 140 degrees and the patella tracks normally. Subcutaneous tissue is closed with 2.0 vicryl and subcuticular with running 4.0 Monocryl. The incision is cleaned and dried and steri-strips and a bulky sterile dressing are applied. The limb is placed into a knee immobilizer and the patient is awakened and transported to recovery in stable condition.      Please note that a surgical assistant was a medical necessity for this procedure in order to perform it in a safe and expeditious manner. Surgical assistant was necessary to retract the ligaments and vital neurovascular structures to prevent injury to them and also necessary for proper positioning of the limb to allow for anatomic placement of the prosthesis.   Gus Rankin Miu Chiong, MD    06/12/2020, 9:42 AM

## 2020-06-12 NOTE — Anesthesia Procedure Notes (Signed)
Anesthesia Regional Block: Adductor canal block   Pre-Anesthetic Checklist: ,, timeout performed, Correct Patient, Correct Site, Correct Laterality, Correct Procedure, Correct Position, site marked, Risks and benefits discussed,  Surgical consent,  Pre-op evaluation,  At surgeon's request and post-op pain management  Laterality: Right  Prep: chloraprep       Needles:  Injection technique: Single-shot  Needle Type: Echogenic Needle     Needle Length: 9cm  Needle Gauge: 21     Additional Needles:   Narrative:  Start time: 06/12/2020 7:30 AM End time: 06/12/2020 7:40 AM Injection made incrementally with aspirations every 5 mL.  Performed by: Personally  Anesthesiologist: Achille Rich, MD  Additional Notes: Pt tolerated the procedure well.

## 2020-06-12 NOTE — Anesthesia Procedure Notes (Signed)
Spinal  Patient location during procedure: OR Start time: 06/12/2020 8:20 AM End time: 06/12/2020 8:26 AM Staffing Performed: resident/CRNA  Resident/CRNA: Garth Bigness, CRNA Preanesthetic Checklist Completed: patient identified, IV checked, site marked, risks and benefits discussed, surgical consent, monitors and equipment checked, pre-op evaluation and timeout performed Spinal Block Patient position: sitting Prep: DuraPrep Patient monitoring: heart rate, cardiac monitor, continuous pulse ox and blood pressure Approach: midline Location: L3-4 Injection technique: single-shot Needle Needle type: Sprotte  Needle gauge: 24 G Needle length: 9 cm Needle insertion depth: 5 cm Assessment Sensory level: T4

## 2020-06-12 NOTE — Progress Notes (Signed)
Assisted Dr. Hodierne with right, ultrasound guided, adductor canal block. Side rails up, monitors on throughout procedure. See vital signs in flow sheet. Tolerated Procedure well.  

## 2020-06-13 ENCOUNTER — Encounter (HOSPITAL_COMMUNITY): Payer: Self-pay | Admitting: Orthopedic Surgery

## 2020-06-30 IMAGING — DX DG PORTABLE PELVIS
1 series · 1 of 1 positions shown · non-contrast
Comparison: Portable exam 3776 hours compared to earlier
intraoperative image of 07/01/2018

CLINICAL DATA: Post hip replacement

EXAM:
PORTABLE PELVIS 1-2 VIEWS

[pelvis ap]
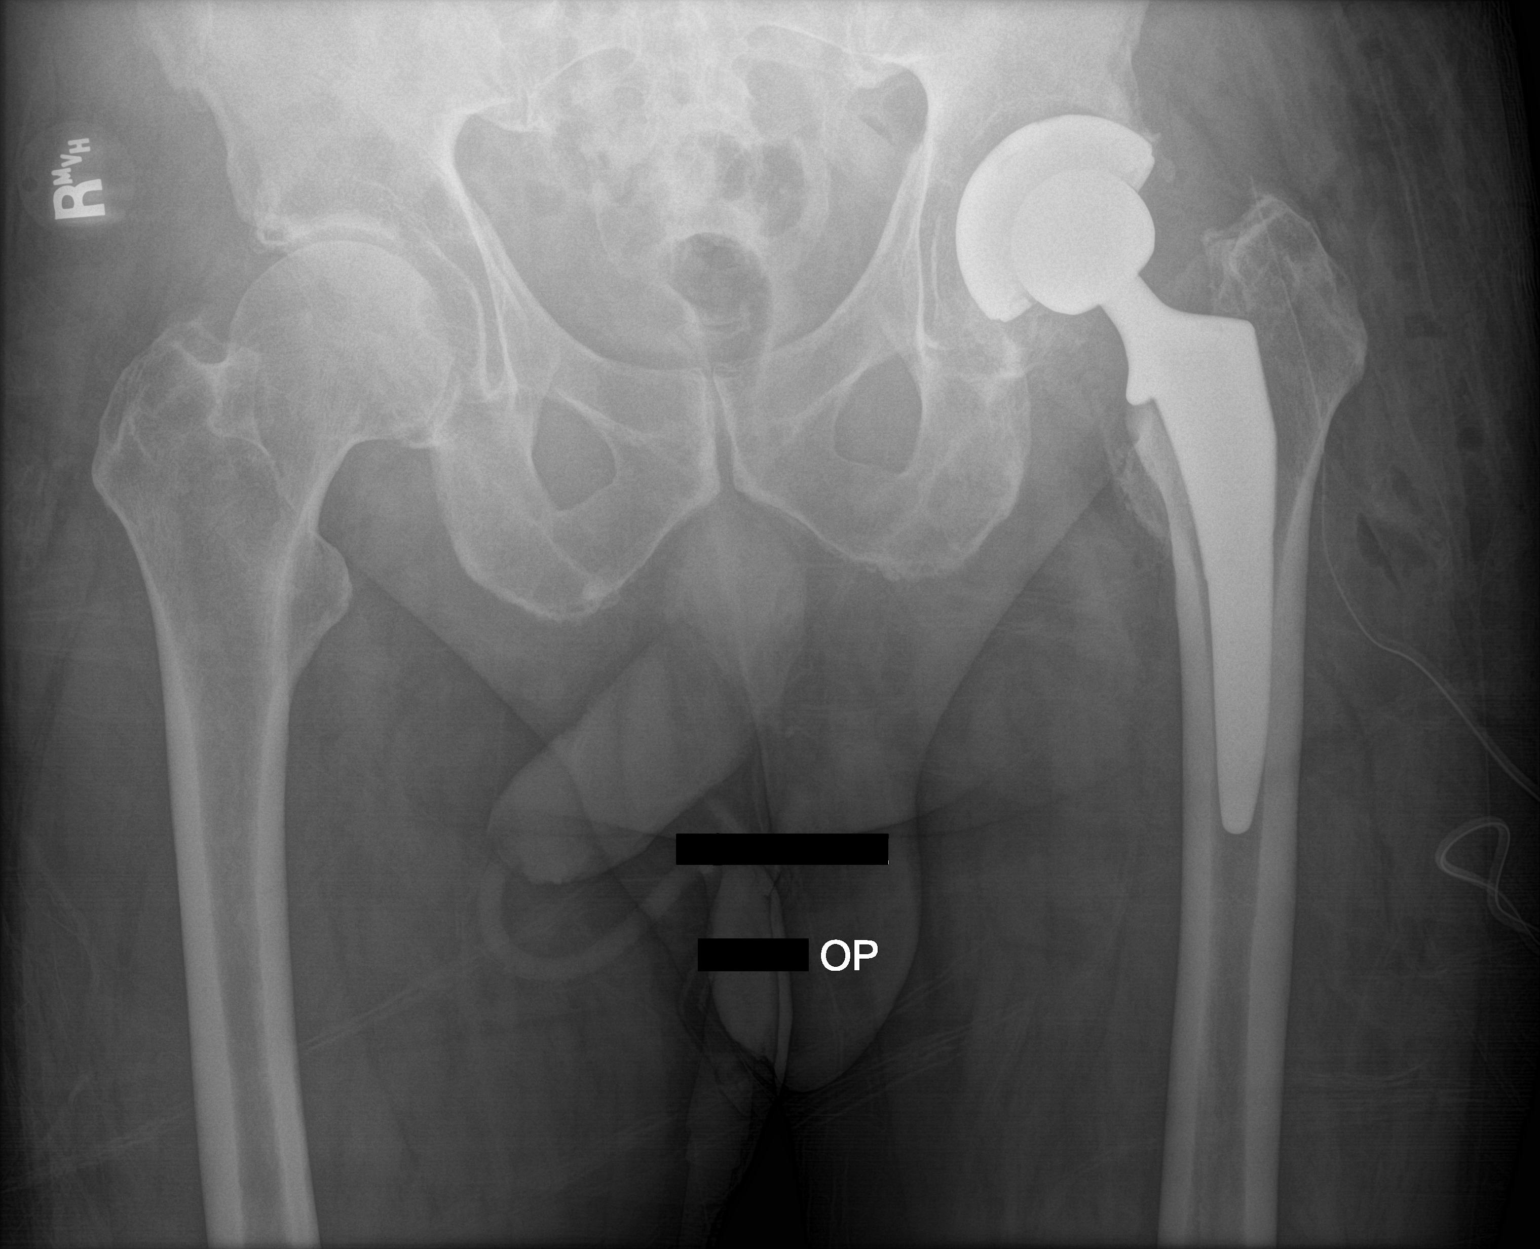

[1 of 1 positions shown; findings below may reference images not displayed]

FINDINGS: Components of a LEFT hip prosthesis are identified.

No fracture, dislocation, or bone destruction.

Overlying surgical drain and postsurgical soft tissue changes.
IMPRESSION: LEFT hip prosthesis without acute complication.

## 2020-06-30 IMAGING — RF DG HIP (WITH PELVIS) OPERATIVE*L*
1 series · 2 of 2 positions shown · non-contrast
Comparison: 03/03/2018 radiographs

CLINICAL DATA: LEFT total hip arthroplasty

EXAM:
OPERATIVE LEFT HIP (WITH PELVIS IF PERFORMED) 2 VIEWS
TECHNIQUE: Fluoroscopic spot image(s) were submitted for interpretation
post-operatively.

[Series 1: run · 2 of 2 slices shown]
[im 1/2]
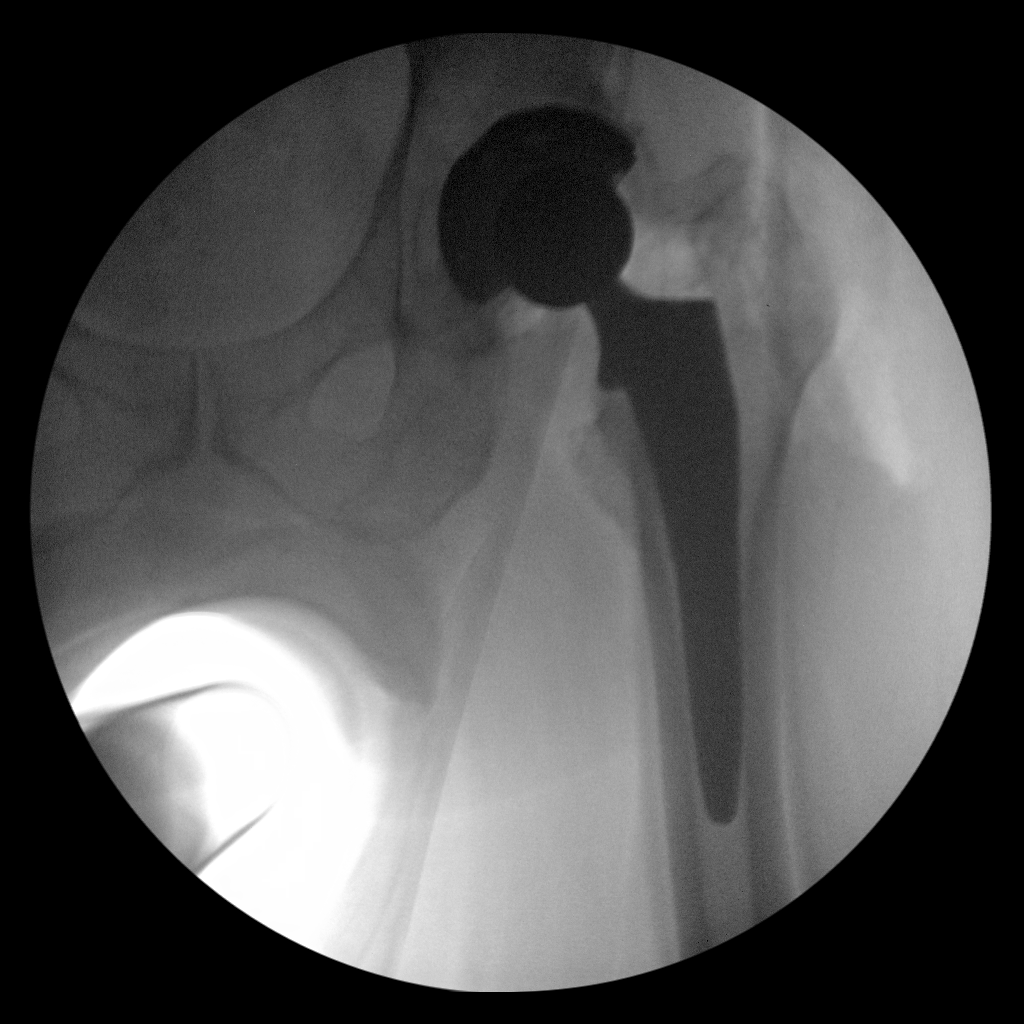
[im 2/2]
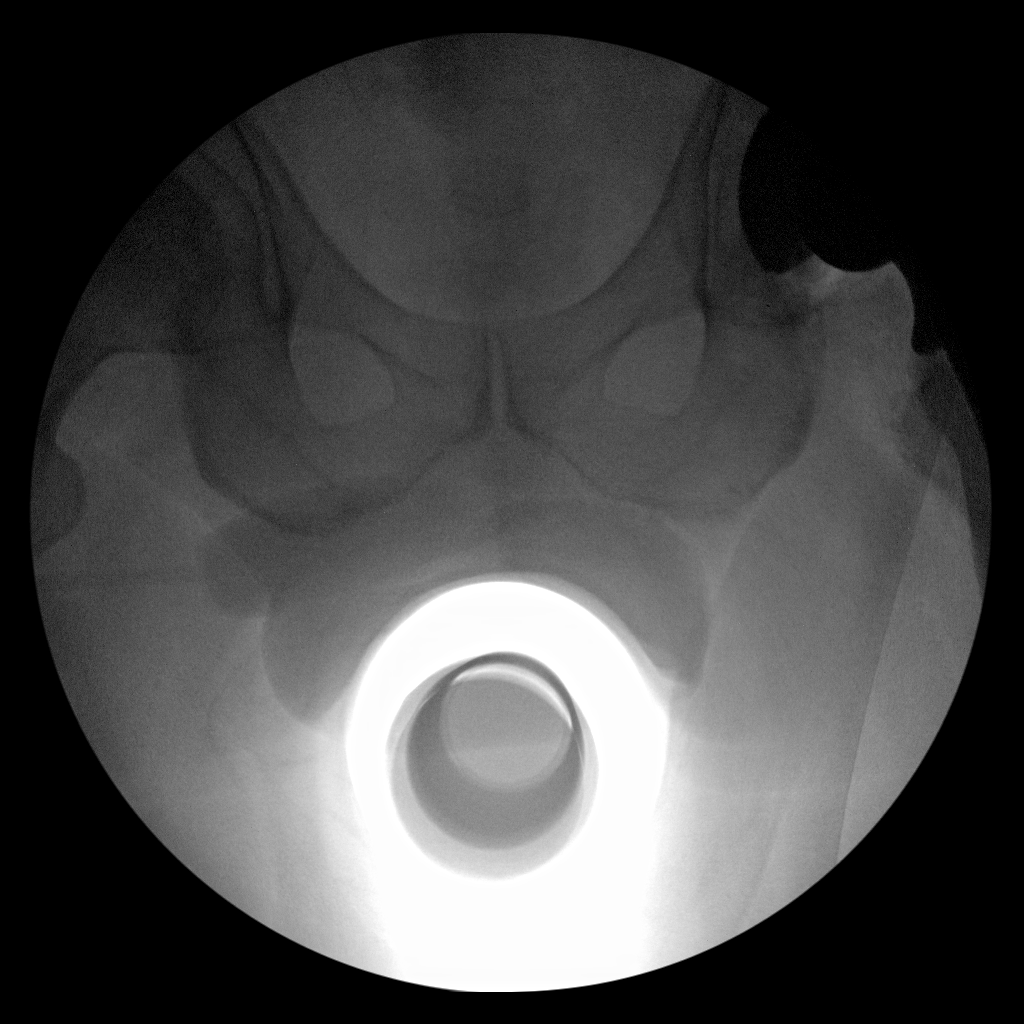

[2 of 2 positions shown; findings below may reference images not displayed]

FINDINGS: Two intraoperative views of the LEFT hip are submitted
postoperatively for interpretation.

LEFT total hip arthroplasty identified without definite complicating
features.
IMPRESSION: LEFT total hip arthroplasty without definite complicating features.
